# Patient Record
Sex: Female | Born: 1945 | Race: White | Hispanic: No | Smoking: Never smoker
Health system: Southern US, Community
[De-identification: ages and names within clinical notes are randomized; demographics above are authoritative.]

## PROBLEM LIST (undated history)

## (undated) DIAGNOSIS — E559 Vitamin D deficiency, unspecified: Secondary | ICD-10-CM

## (undated) DIAGNOSIS — K219 Gastro-esophageal reflux disease without esophagitis: Secondary | ICD-10-CM

## (undated) DIAGNOSIS — F329 Major depressive disorder, single episode, unspecified: Secondary | ICD-10-CM

## (undated) DIAGNOSIS — G35 Multiple sclerosis: Secondary | ICD-10-CM

## (undated) DIAGNOSIS — R42 Dizziness and giddiness: Secondary | ICD-10-CM

## (undated) DIAGNOSIS — F32A Depression, unspecified: Secondary | ICD-10-CM

## (undated) HISTORY — PX: LAPAROSCOPIC NISSEN FUNDOPLICATION: SHX1932

## (undated) HISTORY — PX: CHOLECYSTECTOMY: SHX55

---

## 2016-10-13 ENCOUNTER — Encounter (HOSPITAL_COMMUNITY)
Admission: EM | Disposition: A | Discharge status: Skilled Nursing Facility | Payer: Self-pay | Source: Home / Self Care | Attending: Orthopaedic Surgery

## 2016-10-13 ENCOUNTER — Emergency Department (HOSPITAL_COMMUNITY): Payer: Medicare Other

## 2016-10-13 ENCOUNTER — Encounter (HOSPITAL_COMMUNITY): Payer: Self-pay

## 2016-10-13 ENCOUNTER — Inpatient Hospital Stay (HOSPITAL_COMMUNITY)
Admission: EM | Admit: 2016-10-13 | Discharge: 2016-10-18 | DRG: 494 | Disposition: A | Discharge status: Skilled Nursing Facility | Payer: Medicare Other | Attending: Orthopaedic Surgery | Admitting: Orthopaedic Surgery

## 2016-10-13 DIAGNOSIS — S82401B Unspecified fracture of shaft of right fibula, initial encounter for open fracture type I or II: Secondary | ICD-10-CM | POA: Diagnosis present

## 2016-10-13 DIAGNOSIS — S82301B Unspecified fracture of lower end of right tibia, initial encounter for open fracture type I or II: Secondary | ICD-10-CM

## 2016-10-13 DIAGNOSIS — G35 Multiple sclerosis: Secondary | ICD-10-CM | POA: Diagnosis present

## 2016-10-13 DIAGNOSIS — R339 Retention of urine, unspecified: Secondary | ICD-10-CM | POA: Diagnosis not present

## 2016-10-13 DIAGNOSIS — W19XXXA Unspecified fall, initial encounter: Secondary | ICD-10-CM | POA: Diagnosis present

## 2016-10-13 DIAGNOSIS — E1136 Type 2 diabetes mellitus with diabetic cataract: Secondary | ICD-10-CM | POA: Diagnosis present

## 2016-10-13 DIAGNOSIS — Z419 Encounter for procedure for purposes other than remedying health state, unspecified: Secondary | ICD-10-CM

## 2016-10-13 DIAGNOSIS — E1151 Type 2 diabetes mellitus with diabetic peripheral angiopathy without gangrene: Secondary | ICD-10-CM | POA: Diagnosis present

## 2016-10-13 DIAGNOSIS — F329 Major depressive disorder, single episode, unspecified: Secondary | ICD-10-CM | POA: Diagnosis present

## 2016-10-13 DIAGNOSIS — K449 Diaphragmatic hernia without obstruction or gangrene: Secondary | ICD-10-CM | POA: Diagnosis present

## 2016-10-13 DIAGNOSIS — H269 Unspecified cataract: Secondary | ICD-10-CM | POA: Diagnosis present

## 2016-10-13 DIAGNOSIS — M6281 Muscle weakness (generalized): Secondary | ICD-10-CM

## 2016-10-13 DIAGNOSIS — S82201B Unspecified fracture of shaft of right tibia, initial encounter for open fracture type I or II: Secondary | ICD-10-CM | POA: Diagnosis not present

## 2016-10-13 DIAGNOSIS — Z96651 Presence of right artificial knee joint: Secondary | ICD-10-CM | POA: Diagnosis present

## 2016-10-13 HISTORY — DX: Depression, unspecified: F32.A

## 2016-10-13 HISTORY — DX: Major depressive disorder, single episode, unspecified: F32.9

## 2016-10-13 HISTORY — DX: Multiple sclerosis: G35

## 2016-10-13 HISTORY — PX: EXTERNAL FIXATION LEG: SHX1549

## 2016-10-13 LAB — BASIC METABOLIC PANEL
ANION GAP: 11 (ref 5–15)
BUN: 14 mg/dL (ref 6–20)
CHLORIDE: 106 mmol/L (ref 101–111)
CO2: 22 mmol/L (ref 22–32)
Calcium: 9.1 mg/dL (ref 8.9–10.3)
Creatinine, Ser: 0.77 mg/dL (ref 0.44–1.00)
GFR calc Af Amer: >60 mL/min (ref >60)
GFR calc non Af Amer: >60 mL/min (ref >60)
GLUCOSE: 122 mg/dL — AB (ref 65–99)
POTASSIUM: 4.3 mmol/L (ref 3.5–5.1)
Sodium: 139 mmol/L (ref 135–145)

## 2016-10-13 LAB — CK: CK TOTAL: 208 U/L (ref 38–234)

## 2016-10-13 LAB — CBC WITH DIFFERENTIAL/PLATELET
BASOS ABS: 0 10*3/uL (ref 0.0–0.1)
Basophils Relative: 0 %
EOS PCT: 1 %
Eosinophils Absolute: 0.1 10*3/uL (ref 0.0–0.7)
HEMATOCRIT: 38 % (ref 36.0–46.0)
HEMOGLOBIN: 12.8 g/dL (ref 12.0–15.0)
LYMPHS ABS: 0.9 10*3/uL (ref 0.7–4.0)
LYMPHS PCT: 6 %
MCH: 30.4 pg (ref 26.0–34.0)
MCHC: 33.7 g/dL (ref 30.0–36.0)
MCV: 90.3 fL (ref 78.0–100.0)
Monocytes Absolute: 0.7 10*3/uL (ref 0.1–1.0)
Monocytes Relative: 5 %
NEUTROS ABS: 13.3 10*3/uL — AB (ref 1.7–7.7)
NEUTROS PCT: 88 %
Platelets: 318 10*3/uL (ref 150–400)
RBC: 4.21 MIL/uL (ref 3.87–5.11)
RDW: 13.1 % (ref 11.5–15.5)
WBC: 15.1 10*3/uL — AB (ref 4.0–10.5)

## 2016-10-13 LAB — PROTIME-INR
INR: 0.89
Prothrombin Time: 12 seconds (ref 11.4–15.2)

## 2016-10-13 SURGERY — EXTERNAL FIXATION, LOWER EXTREMITY
Anesthesia: General | Laterality: Right

## 2016-10-13 MED ORDER — FENTANYL CITRATE (PF) 100 MCG/2ML IJ SOLN
25.0000 ug | Freq: Once | INTRAMUSCULAR | Status: AC
  Administered 2016-10-13: 25 ug via INTRAVENOUS
  Filled 2016-10-13: qty 2

## 2016-10-13 MED ORDER — TETANUS-DIPHTH-ACELL PERTUSSIS 5-2.5-18.5 LF-MCG/0.5 IM SUSP
0.5000 mL | Freq: Once | INTRAMUSCULAR | Status: AC
  Administered 2016-10-13: 0.5 mL via INTRAMUSCULAR
  Filled 2016-10-13: qty 0.5

## 2016-10-13 MED ORDER — ONDANSETRON HCL 4 MG/2ML IJ SOLN
4.0000 mg | Freq: Once | INTRAMUSCULAR | Status: AC
  Administered 2016-10-13: 4 mg via INTRAVENOUS
  Filled 2016-10-13: qty 2

## 2016-10-13 MED ORDER — CEFAZOLIN SODIUM-DEXTROSE 1-4 GM/50ML-% IV SOLN
1.0000 g | Freq: Once | INTRAVENOUS | Status: AC
  Administered 2016-10-13: 1 g via INTRAVENOUS
  Filled 2016-10-13: qty 50

## 2016-10-13 SURGICAL SUPPLY — 50 items
BANDAGE ACE 4X5 VEL STRL LF (GAUZE/BANDAGES/DRESSINGS) ×3 IMPLANT
BANDAGE ACE 6X5 VEL STRL LF (GAUZE/BANDAGES/DRESSINGS) ×3 IMPLANT
BANDAGE ESMARK 6X9 LF (GAUZE/BANDAGES/DRESSINGS) ×1 IMPLANT
BAR EXFX 350X11 NS LF (EXFIX) ×2
BAR GLASS FIBER EXFX 11X350 (EXFIX) ×4 IMPLANT
BNDG CMPR 9X6 STRL LF SNTH (GAUZE/BANDAGES/DRESSINGS) ×1
BNDG COHESIVE 6X5 TAN STRL LF (GAUZE/BANDAGES/DRESSINGS) ×3 IMPLANT
BNDG ESMARK 6X9 LF (GAUZE/BANDAGES/DRESSINGS) ×3
BNDG GAUZE ELAST 4 BULKY (GAUZE/BANDAGES/DRESSINGS) ×4 IMPLANT
CLAMP BLUE BAR TO PIN (EXFIX) ×4 IMPLANT
COVER SURGICAL LIGHT HANDLE (MISCELLANEOUS) ×3 IMPLANT
CUFF TOURNIQUET SINGLE 24IN (TOURNIQUET CUFF) IMPLANT
CUFF TOURNIQUET SINGLE 34IN LL (TOURNIQUET CUFF) IMPLANT
DRAPE C-ARM 42X72 X-RAY (DRAPES) ×2 IMPLANT
DRAPE U-SHAPE 47X51 STRL (DRAPES) ×3 IMPLANT
DRSG ADAPTIC 3X8 NADH LF (GAUZE/BANDAGES/DRESSINGS) ×3 IMPLANT
ELECT REM PT RETURN 9FT ADLT (ELECTROSURGICAL) ×3
ELECTRODE REM PT RTRN 9FT ADLT (ELECTROSURGICAL) ×1 IMPLANT
GAUZE SPONGE 4X4 12PLY STRL (GAUZE/BANDAGES/DRESSINGS) ×3 IMPLANT
GAUZE XEROFORM 5X9 LF (GAUZE/BANDAGES/DRESSINGS) ×2 IMPLANT
GLOVE BIOGEL PI IND STRL 8 (GLOVE) ×2 IMPLANT
GLOVE BIOGEL PI INDICATOR 8 (GLOVE) ×4
GLOVE ORTHO TXT STRL SZ7.5 (GLOVE) ×6 IMPLANT
GOWN STRL REUS W/ TWL LRG LVL3 (GOWN DISPOSABLE) ×2 IMPLANT
GOWN STRL REUS W/TWL 2XL LVL3 (GOWN DISPOSABLE) ×3 IMPLANT
GOWN STRL REUS W/TWL LRG LVL3 (GOWN DISPOSABLE) ×6
KIT BASIN OR (CUSTOM PROCEDURE TRAY) ×3 IMPLANT
KIT ROOM TURNOVER OR (KITS) ×3 IMPLANT
MANIFOLD NEPTUNE II (INSTRUMENTS) ×3 IMPLANT
NS IRRIG 1000ML POUR BTL (IV SOLUTION) ×5 IMPLANT
PACK ORTHO EXTREMITY (CUSTOM PROCEDURE TRAY) ×3 IMPLANT
PAD ARMBOARD 7.5X6 YLW CONV (MISCELLANEOUS) ×6 IMPLANT
PADDING CAST COTTON 6X4 STRL (CAST SUPPLIES) ×9 IMPLANT
PENCIL BUTTON HOLSTER BLD 10FT (ELECTRODE) IMPLANT
PIN CLAMP 2BAR 75MM BLUE (EXFIX) ×2 IMPLANT
PIN HALF YELLOW 5X160X35 (EXFIX) ×4 IMPLANT
PIN TRANSFIXING 5.0 (EXFIX) ×2 IMPLANT
SPONGE LAP 18X18 X RAY DECT (DISPOSABLE) ×3 IMPLANT
STOCKINETTE IMPERVIOUS LG (DRAPES) ×3 IMPLANT
SUT ETHILON 3 0 PS 1 (SUTURE) IMPLANT
SUT VIC AB 0 CT1 27 (SUTURE) ×6
SUT VIC AB 0 CT1 27XBRD ANBCTR (SUTURE) ×2 IMPLANT
SUT VIC AB 2-0 CT1 27 (SUTURE) ×6
SUT VIC AB 2-0 CT1 TAPERPNT 27 (SUTURE) ×2 IMPLANT
TOWEL OR 17X24 6PK STRL BLUE (TOWEL DISPOSABLE) ×6 IMPLANT
TOWEL OR 17X26 10 PK STRL BLUE (TOWEL DISPOSABLE) ×3 IMPLANT
TUBE CONNECTING 12'X1/4 (SUCTIONS)
TUBE CONNECTING 12X1/4 (SUCTIONS) IMPLANT
UNDERPAD 30X30 (UNDERPADS AND DIAPERS) ×3 IMPLANT
YANKAUER SUCT BULB TIP NO VENT (SUCTIONS) IMPLANT

## 2016-10-13 NOTE — ED Provider Notes (Signed)
[facility]-EMERGENCY DEPT Provider Note   CSN: [id number] Arrival date & time: 10/13/16  1633     History   Chief Complaint Chief Complaint  Patient presents with  . Fall    HPI Ariana Patton is a 71 y.o. female.  HPI Patient has a history Of MS, depression, DM, neuropathy, GERD who presents from skilled nursing facility after fall. Concern for open right tibia fracture. Unable to speak to staff from facility. Patient is somewhat poor historian, reports falling when trying to use the commode. She denies hitting her head or any LOC. Per EMS, patient was lying on floor 1 hours prior to their arrival.   Past Medical History:  Diagnosis Date  . Depression   . Multiple sclerosis [facility])     Patient Active Problem List   Diagnosis Date Noted  . Open fracture of right tibia and fibula 10/14/2016    Past Surgical History:  Procedure Laterality Date  . EXTERNAL FIXATION LEG Right 10/13/2016   Procedure: Application of external fixator Right lower leg;  Surgeon: Eldred Manges, MD;  Location: [facility] OR;  Service: Orthopedics;  Laterality: Right;    OB History    No data available       Home Medications    Prior to Admission medications   Medication Sig Start Date End Date Taking? Authorizing Provider  buPROPion (WELLBUTRIN SR) 150 MG 12 hr tablet Take 150 mg by mouth 2 (two) times daily.   Yes [provider]  celecoxib (CELEBREX) 100 MG capsule Take 100 mg by mouth 2 (two) times daily.   Yes [provider]  cholestyramine light (PREVALITE) 4 g packet Take 4 g by mouth 2 (two) times daily.   Yes [provider]  fluticasone (FLONASE) 50 MCG/ACT nasal spray Place 1 spray into both nostrils 2 (two) times daily.   Yes [provider]  gabapentin (NEURONTIN) 300 MG capsule Take 300 mg by mouth 3 (three) times daily.   Yes [provider]  mirabegron ER (MYRBETRIQ) 25 MG TB24 tablet Take 25 mg by mouth daily.   Yes [provider]    PARoxetine (PAXIL) 20 MG tablet Take 20 mg by mouth daily.   Yes [provider]  temazepam (RESTORIL) 30 MG capsule Take 30 mg by mouth at bedtime.   Yes [provider]  Vitamin D, Ergocalciferol, (DRISDOL) 50000 units CAPS capsule Take 50,000 Units by mouth every 7 (seven) days.   Yes [provider]    Family History No family history on file.  Social History Social History  Substance Use Topics  . Smoking status: Not on file  . Smokeless tobacco: Not on file  . Alcohol use Not on file     Allergies   Patient has no known allergies.   Review of Systems Review of Systems  Constitutional: Negative for chills and fever.  HENT: Negative for ear pain and sore throat.   Eyes: Negative for pain and visual disturbance.  Respiratory: Negative for cough and shortness of breath.   Cardiovascular: Negative for chest pain and palpitations.  Gastrointestinal: Negative for abdominal pain and vomiting.  Genitourinary: Negative for dysuria and hematuria.  Musculoskeletal: Positive for gait problem and joint swelling. Negative for arthralgias and back pain.  Skin: Negative for color change and rash.  Neurological: Negative for seizures and syncope.  All other systems reviewed and are negative.    Physical Exam Updated Vital Signs BP (!) 85/36 (BP Location: Left Arm)   Pulse 75  Temp 97.6 F (36.4 C) (Oral)   Resp 16   SpO2 95%   Physical Exam  Constitutional: She appears well-developed and well-nourished. No distress.  HENT:  Head: Normocephalic and atraumatic.  Eyes: Conjunctivae are normal.  Neck: Neck supple.  Cardiovascular: Normal rate and regular rhythm.   No murmur heard. Pulmonary/Chest: Effort normal and breath sounds normal. No respiratory distress.  Abdominal: Soft. There is no tenderness.  Musculoskeletal: She exhibits tenderness and deformity. She exhibits no edema.  Right lower extremity with open fracture distal tibia and gross  deformity. Distal pulses intact. Sensation intact.   Neurological: She is alert. No cranial nerve deficit or sensory deficit. She exhibits normal muscle tone.  Skin: Skin is warm and dry.  Psychiatric: She has a normal mood and affect.  Nursing note and vitals reviewed.    ED Treatments / Results  Labs (all labs ordered are listed, but only abnormal results are displayed) Labs Reviewed  CBC WITH DIFFERENTIAL/PLATELET - Abnormal; Notable for the following:       Result Value   WBC 15.1 (*)    Neutro Abs 13.3 (*)    All other components within normal limits  BASIC METABOLIC PANEL - Abnormal; Notable for the following:    Glucose, Bld 122 (*)    All other components within normal limits  PROTIME-INR  CK  URINALYSIS, ROUTINE W REFLEX MICROSCOPIC    EKG  EKG Interpretation  Date/Time:  Friday October 13 2016 19:40:56 EDT Ventricular Rate:  74 PR Interval:    QRS Duration: 91 QT Interval:  397 QTC Calculation: 441 R Axis:   41 Text Interpretation:  Sinus rhythm Low voltage, precordial leads Abnormal R-wave progression, early transition Borderline T abnormalities, anterior leads No old tracing to compare Confirmed by Linwood Dibbles [phone]) on 10/14/2016 12:19:40 AM Also confirmed by Linwood Dibbles [phone]), editor Misty Stanley [phone])  on 10/14/2016 8:52:24 AM       Radiology Dg Chest 1 View  Result Date: 10/13/2016 CLINICAL DATA:  Fall with leg fracture EXAM: CHEST 1 VIEW COMPARISON:  None. FINDINGS: Elevation of the right hemidiaphragm. No pneumothorax or sizable pleural effusion. No focal airspace consolidation or pulmonary edema. Moderate hiatal hernia. IMPRESSION: No active disease. Electronically Signed   By: Deatra Robinson M.D.   On: 10/13/2016 18:06   Dg Tibia/fibula Right  Result Date: 10/14/2016 CLINICAL DATA:  External fixation EXAM: DG C-ARM 61-120 MIN; RIGHT TIBIA AND FIBULA - 2 VIEW COMPARISON:  10/13/2016 FINDINGS: Total fluoroscopy time was 18 seconds. Two low  resolution spot intraoperative views of the right tibia and fibula. Re- demonstrated comminuted and displaced fractures of the distal shafts of the tibia and fibula with decreased angulation compared to preoperative radiographs. IMPRESSION: Intraoperative fluoroscopic assistance provided during external fixation of tibial and fibular fracture Electronically Signed   By: Jasmine Pang M.D.   On: 10/14/2016 02:13   Dg Tibia/fibula Right  Result Date: 10/13/2016 CLINICAL DATA:  Fall.  Open right tib-fib fracture. EXAM: RIGHT TIBIA AND FIBULA - 2 VIEW COMPARISON:  None. FINDINGS: Comminuted oblique non articular distal shaft right tibia fracture with mild apex medial angulation and 15 mm medial displacement of the dominant distal fracture fragment. Comminuted distal shaft oblique non articular right fibular fracture with mild apex medial angulation and 8-9 mm medial and posterior displacement of the dominant distal fracture fragment, noting a 5 cm posterior butterfly fracture fragment in the right distal fibula. No suspicious focal osseous lesions. Partially visualized right total knee arthroplasty hardware  with surgical plate overlying the distal right femur. IMPRESSION: Comminuted displaced angulated non articular distal shaft fractures in the right tibia and fibula as detailed. Electronically Signed   By: Delbert Phenix M.D.   On: 10/13/2016 18:08   Ct Head Wo Contrast  Result Date: 10/13/2016 CLINICAL DATA:  Pain following fall EXAM: CT HEAD WITHOUT CONTRAST TECHNIQUE: Contiguous axial images were obtained from the base of the skull through the vertex without intravenous contrast. COMPARISON:  None. FINDINGS: Brain: There is moderate diffuse atrophy. There is no intracranial mass, hemorrhage, extra-axial fluid collection, or midline shift. There is small vessel disease throughout the centra semiovale bilaterally. Elsewhere gray-white compartments appear normal. No evident acute infarct. Vascular: There is no  appreciable hyperdense vessel. There is calcification in each carotid siphon. Skull: The bony calvarium appears intact. Sinuses/Orbits: There is mild mucosal thickening in several ethmoid air cells bilaterally. Other visualized paranasal sinuses are clear. Orbits appear symmetric bilaterally. Patient has had cataract removals bilaterally. Other: Mastoid air cells are clear. IMPRESSION: Atrophy with small vessel disease throughout much of the periventricular white matter. No intracranial mass, hemorrhage, or extra-axial fluid collection. No evident acute infarct. There are areas of arterial vascular calcification. Areas of ethmoid sinus disease noted. Electronically Signed   By: Bretta Bang III M.D.   On: 10/13/2016 18:10   Dg C-arm 1-60 Min  Result Date: 10/14/2016 CLINICAL DATA:  External fixation EXAM: DG C-ARM 61-120 MIN; RIGHT TIBIA AND FIBULA - 2 VIEW COMPARISON:  10/13/2016 FINDINGS: Total fluoroscopy time was 18 seconds. Two low resolution spot intraoperative views of the right tibia and fibula. Re- demonstrated comminuted and displaced fractures of the distal shafts of the tibia and fibula with decreased angulation compared to preoperative radiographs. IMPRESSION: Intraoperative fluoroscopic assistance provided during external fixation of tibial and fibular fracture Electronically Signed   By: Jasmine Pang M.D.   On: 10/14/2016 02:13    Procedures Procedures (including critical care time)  Medications Ordered in ED Medications  buPROPion [facility] SR) 12 hr tablet 150 mg (150 mg Oral Given 10/14/16 0836)  celecoxib (CELEBREX) capsule 100 mg (not administered)  cholestyramine light (PREVALITE) packet 4 g (4 g Oral Given 10/14/16 0833)  fluticasone (FLONASE) 50 MCG/ACT nasal spray 1 spray (1 spray Each Nare Given 10/14/16 0838)  gabapentin (NEURONTIN) capsule 300 mg (300 mg Oral Given 10/14/16 0835)  mirabegron ER (MYRBETRIQ) tablet 25 mg (25 mg Oral Given 10/14/16 0846)  PARoxetine  (PAXIL) tablet 20 mg (20 mg Oral Given 10/14/16 0836)  temazepam (RESTORIL) capsule 30 mg (not administered)  Vitamin D (Ergocalciferol) (DRISDOL) capsule 50,000 Units (not administered)  acetaminophen (TYLENOL) tablet 650 mg (650 mg Oral Given 10/14/16 0550)    Or  acetaminophen (TYLENOL) suppository 650 mg ( Rectal See Alternative 10/14/16 0550)  ondansetron (ZOFRAN) tablet 4 mg ( Oral See Alternative 10/14/16 0549)    Or  ondansetron (ZOFRAN) injection 4 mg (4 mg Intravenous Given 10/14/16 0549)  metoCLOPramide (REGLAN) tablet 5-10 mg (not administered)    Or  metoCLOPramide (REGLAN) injection 5-10 mg (not administered)  0.45 % sodium chloride infusion ( Intravenous New Bag/Given 10/14/16 0404)  ceFAZolin (ANCEF) IVPB 1 g/50 mL premix (1 g Intravenous New Bag/Given 10/14/16 1146)  ketorolac (TORADOL) 15 MG/ML injection 7.5 mg (7.5 mg Intravenous Given 10/14/16 0839)  oxyCODONE (Oxy IR/ROXICODONE) immediate release tablet 5-10 mg (10 mg Oral Given 10/14/16 1037)  morphine 4 MG/ML injection 2 mg (2 mg Intravenous Given 10/14/16 0543)  methocarbamol (ROBAXIN) tablet 500 mg (500  mg Oral Given 10/14/16 1037)    Or  methocarbamol (ROBAXIN) 500 mg in dextrose 5 % 50 mL IVPB ( Intravenous See Alternative 10/14/16 1037)  docusate sodium (COLACE) capsule 100 mg (100 mg Oral Not Given 10/14/16 0835)  polyethylene glycol (MIRALAX / GLYCOLAX) packet 17 g (not administered)  bisacodyl (DULCOLAX) suppository 10 mg (not administered)  sodium phosphate (FLEET) 7-19 GM/118ML enema 1 enema (not administered)  ondansetron (ZOFRAN) 4 MG/2ML injection (not administered)  ketorolac (TORADOL) 15 MG/ML injection (not administered)  ceFAZolin (ANCEF) IVPB 1 g/50 mL premix (0 g Intravenous Stopped 10/13/16 1857)  Tdap (BOOSTRIX) injection 0.5 mL (0.5 mLs Intramuscular Given 10/13/16 1827)  fentaNYL (SUBLIMAZE) injection 25 mcg (25 mcg Intravenous Given 10/13/16 1827)  ondansetron (ZOFRAN) injection 4 mg (4 mg Intravenous Given  10/13/16 1827)  fentaNYL (SUBLIMAZE) injection 25 mcg (25 mcg Intravenous Given 10/13/16 2044)  ondansetron (ZOFRAN) injection 4 mg (4 mg Intravenous Given 10/14/16 0242)     Initial Impression / Assessment and Plan / ED Course  I have reviewed the triage vital signs and the nursing notes.  Pertinent labs & imaging results that were available during my care of the patient were reviewed by me and considered in my medical decision making (see chart for details).    Patient is a 71 y/o female with history of MS, depression, DM, neuropathy, GERD who presents from skilled nursing facility who presents after unobserved ground level fall. Noted to have open fracture of right tib/fib, neurovascularly intact. Exam as above.   Unable to obtain additional history from nursing facility. Syncopal work up completed, significant for Negative CT head, chest x-ray, and stable labs.  Patient treated with ancef and tetanus updated, as well as pain control.   Discussed with Orthopedics. Plan for admission to OR for I&D and ex-fix. Hospitalist consulted, felt fall was mechanical in nature. Patient in agreement with plan at time of admission.  Patient and plan of care discussed with Attending physician, Dr. Lynelle Doctor.    Final Clinical Impressions(s) / ED Diagnoses   Final diagnoses:  Type I or II open fracture of distal end of right tibia, unspecified fracture morphology, initial encounter    New Prescriptions Current Discharge Medication List       Wynelle Cleveland, MD 10/14/16 1419

## 2016-10-13 NOTE — Consult Note (Signed)
Hospitalist Service Medical Consultation   Marikay Roads  RUE:[id number]  DOB: 03-11-1945  DOA: 10/13/2016  PCP: Patient, No Pcp Per      Requesting physician: Wynelle Cleveland, MD  Reason for consultation: Fall   History of Present Illness: Merlyn Bollen is an 71 y.o. female with past medical history significant for advanced MS who presents with fall and open tib-fib fracture and for whom [facility] is consulted regarding diabetes management and evaluation of syncope.  The patient tells me that she moved to [facility] to be near family a few months ago for rehabilitation after a hospitalization in [place] for a particularly bad MS flare. Since then she has been doing well in rehabilitation, walking with a walker, doing well. She was trying to go to the bathroom, and transfer herself to the commode, but neglected to call AMA because she thought she could do it herself. However her "legs gave out", as they often do, and she fell, hurting her leg severely.  She had no loss of consciousness.  She had no preceding dizziness, lightheadedness, chest pain, SOB, palpitations, nor does she have any of those symptoms now, only severe leg pain.  She has no history of coronary disease, other heart disease, or diabetes.      Review of Systems:  12 systems were reviewed and were negative  Past Medical History: Past Medical History:  Diagnosis Date  . Depression   . Multiple sclerosis (HCC)     Past Surgical History: History reviewed. No pertinent surgical history.   Allergies:  No Known Allergies   Social History:  has no tobacco, alcohol, and drug history on file.     Physical Exam: Vitals:   10/13/16 1700 10/13/16 1815 10/13/16 1900 10/13/16 2000  BP: 106/70 112/72 (!) 105/91 (!) 118/59  Pulse: 71 75 84 76  Resp:   11 15  SpO2: 93% 97% 98% 96%    Constitutional: Alert and awake, oriented x3, not in any acute distress. Eyes: PERLA, EOMI, irises appear normal,  anicteric sclera,  ENMT: external ears and nose appear normal, hearing normal            Lips appears dry, oropharynx mucosa, tongue, posterior pharynx appear normal  CVS: S1-S2 clear, no murmur rubs or gallops Respiratory:  Respiratory effort normal. No accessory muscle use.  Musculoskeletal: contractures in both arms, legs.  Fracture covered  Neuro: Cranial nerves II-XII intact, strength globally weak but symmetric. Leg testing not done Psych: judgement and insight appear normal, stable mood and affect, mental status Skin: no rashes or lesions or ulcers, no induration or nodules    Data reviewed:  I have personally reviewed following labs and imaging studies Labs:  CBC:  Recent Labs Lab 10/13/16 1841  WBC 15.1*  NEUTROABS 13.3*  HGB 12.8  HCT 38.0  MCV 90.3  PLT 318    Basic Metabolic Panel:  Recent Labs Lab 10/13/16 1841  NA 139  K 4.3  CL 106  CO2 22  GLUCOSE 122*  BUN 14  CREATININE 0.77  CALCIUM 9.1   GFR CrCl cannot be calculated (Unknown ideal weight.). Liver Function Tests: No results for input(s): AST, ALT, ALKPHOS, BILITOT, PROT, ALBUMIN in the last 168 hours. No results for input(s): LIPASE, AMYLASE in the last 168 hours. No results for input(s): AMMONIA in the last 168 hours. Coagulation profile  Recent Labs Lab 10/13/16 1841  INR 0.89    Cardiac  Enzymes:  Recent Labs Lab 10/13/16 1841  CKTOTAL 208   BNP: Invalid input(s): POCBNP CBG: No results for input(s): GLUCAP in the last 168 hours. D-Dimer No results for input(s): DDIMER in the last 72 hours. Hgb A1c No results for input(s): HGBA1C in the last 72 hours. Lipid Profile No results for input(s): CHOL, HDL, LDLCALC, TRIG, CHOLHDL, LDLDIRECT in the last 72 hours. Thyroid function studies No results for input(s): TSH, T4TOTAL, T3FREE, THYROIDAB in the last 72 hours.  Invalid input(s): FREET3 Anemia work up No results for input(s): VITAMINB12, FOLATE, FERRITIN, TIBC, IRON,  RETICCTPCT in the last 72 hours. Urinalysis No results found for: COLORURINE, APPEARANCEUR, LABSPEC, PHURINE, GLUCOSEU, HGBUR, BILIRUBINUR, KETONESUR, PROTEINUR, UROBILINOGEN, NITRITE, LEUKOCYTESUR   Sepsis Labs Invalid input(s): PROCALCITONIN,  WBC,  LACTICIDVEN Microbiology No results found for this or any previous visit (from the past 240 hour(s)).     Inpatient Medications:   Scheduled Meds: . fentaNYL (SUBLIMAZE) injection  25 mcg Intravenous Once   Continuous Infusions:   Radiological Exams on Admission: Dg Chest 1 View  Result Date: 10/13/2016 CLINICAL DATA:  Fall with leg fracture EXAM: CHEST 1 VIEW COMPARISON:  None. FINDINGS: Elevation of the right hemidiaphragm. No pneumothorax or sizable pleural effusion. No focal airspace consolidation or pulmonary edema. Moderate hiatal hernia. IMPRESSION: No active disease. Electronically Signed   By: Deatra Robinson M.D.   On: 10/13/2016 18:06   Dg Tibia/fibula Right  Result Date: 10/13/2016 CLINICAL DATA:  Fall.  Open right tib-fib fracture. EXAM: RIGHT TIBIA AND FIBULA - 2 VIEW COMPARISON:  None. FINDINGS: Comminuted oblique non articular distal shaft right tibia fracture with mild apex medial angulation and 15 mm medial displacement of the dominant distal fracture fragment. Comminuted distal shaft oblique non articular right fibular fracture with mild apex medial angulation and 8-9 mm medial and posterior displacement of the dominant distal fracture fragment, noting a 5 cm posterior butterfly fracture fragment in the right distal fibula. No suspicious focal osseous lesions. Partially visualized right total knee arthroplasty hardware with surgical plate overlying the distal right femur. IMPRESSION: Comminuted displaced angulated non articular distal shaft fractures in the right tibia and fibula as detailed. Electronically Signed   By: Delbert Phenix M.D.   On: 10/13/2016 18:08   Ct Head Wo Contrast  Result Date: 10/13/2016 CLINICAL DATA:   Pain following fall EXAM: CT HEAD WITHOUT CONTRAST TECHNIQUE: Contiguous axial images were obtained from the base of the skull through the vertex without intravenous contrast. COMPARISON:  None. FINDINGS: Brain: There is moderate diffuse atrophy. There is no intracranial mass, hemorrhage, extra-axial fluid collection, or midline shift. There is small vessel disease throughout the centra semiovale bilaterally. Elsewhere gray-white compartments appear normal. No evident acute infarct. Vascular: There is no appreciable hyperdense vessel. There is calcification in each carotid siphon. Skull: The bony calvarium appears intact. Sinuses/Orbits: There is mild mucosal thickening in several ethmoid air cells bilaterally. Other visualized paranasal sinuses are clear. Orbits appear symmetric bilaterally. Patient has had cataract removals bilaterally. Other: Mastoid air cells are clear. IMPRESSION: Atrophy with small vessel disease throughout much of the periventricular white matter. No intracranial mass, hemorrhage, or extra-axial fluid collection. No evident acute infarct. There are areas of arterial vascular calcification. Areas of ethmoid sinus disease noted. Electronically Signed   By: Bretta Bang III M.D.   On: 10/13/2016 18:10    Impression/Recommendations 1. Fall and tib-fib fracture: The patient appears to me a reliable historian, gives a cogent history of falls, similar to previous  falls (which are a result of substantial morbidity from her MS).  She did not syncopize. -Fracture care per Orthopedics -Would be reasonable to continue Paxil, gabapentin, Wellbutrin, Restoril, cholestyramine, and mirabegron perioperatively, only caution regarding sedation and post-op delirium with oversedation plus Restoril.  2. Reported diabetes: The patient does not have diabetes.    Thank you for this consultation.  The hospitalist team will sign off, please call back if medical issues arise.   Alberteen Sam M.D. [facility] 10/13/2016, 8:41 PM

## 2016-10-13 NOTE — ED Provider Notes (Signed)
Patient presents to the emergency room for evaluation after falling in the bathroom. Patient's a resident of a nursing facility.  Patient is not exactly sure how she fell. No known loss of consciousness however the patient is a bit confused about what occurred.  On exam patient has an open wound on her right lower tib-fib consistent with an open fracture. There is no visible bone but there is a break in the skin and some visible adipose tissue.  Plan on laboratory tests, preop clearance. Anticipate she'll need to go to the OR for open fractures.  I saw and evaluated the patient, reviewed the resident's note and I agree with the findings and plan.   EKG Interpretation  Date/Time:  Friday October 13 2016 19:40:56 EDT Ventricular Rate:  74 PR Interval:    QRS Duration: 91 QT Interval:  397 QTC Calculation: 441 R Axis:   41 Text Interpretation:  Sinus rhythm Low voltage, precordial leads Abnormal R-wave progression, early transition Borderline T abnormalities, anterior leads No old tracing to compare Confirmed by Linwood Dibbles [phone]) on 10/14/2016 12:19:40 AM Also confirmed by Linwood Dibbles [phone]), editor Misty Stanley [phone])  on 10/14/2016 8:52:24 AM         Linwood Dibbles, MD 10/15/16 1925

## 2016-10-13 NOTE — H&P (Signed)
Ariana Patton is an 71 y.o. female.   Chief Complaint: Fall with right open tib-fib fracture, grade 26-14 HPI: 71 year old female who has been in the skilled nursing facility previous right total knee arthroplasty with right distal femur fracture fixed with a lateral plate was found on the floor with unobserved fall. She has a history of multiple sclerosis  Past Medical History:  Diagnosis Date  . Depression   . Multiple sclerosis ([place])     History reviewed. No pertinent surgical history.  No family history on file. Social History:  has no tobacco, alcohol, and drug history on file.  Allergies: No Known Allergies   (Not in a hospital admission)  Results for orders placed or performed during the hospital encounter of 10/13/16 (from the past 48 hour(s))  CBC with Differential     Status: Abnormal   Collection Time: 10/13/16  6:41 PM  Result Value Ref Range   WBC 15.1 (H) 4.0 - 10.5 K/uL   RBC 4.21 3.87 - 5.11 MIL/uL   Hemoglobin 12.8 12.0 - 15.0 g/dL   HCT 38.0 36.0 - 46.0 %   MCV 90.3 78.0 - 100.0 fL   MCH 30.4 26.0 - 34.0 pg   MCHC 33.7 30.0 - 36.0 g/dL   RDW 13.1 11.5 - 15.5 %   Platelets 318 150 - 400 K/uL   Neutrophils Relative % 88 %   Neutro Abs 13.3 (H) 1.7 - 7.7 K/uL   Lymphocytes Relative 6 %   Lymphs Abs 0.9 0.7 - 4.0 K/uL   Monocytes Relative 5 %   Monocytes Absolute 0.7 0.1 - 1.0 K/uL   Eosinophils Relative 1 %   Eosinophils Absolute 0.1 0.0 - 0.7 K/uL   Basophils Relative 0 %   Basophils Absolute 0.0 0.0 - 0.1 K/uL  Basic metabolic panel     Status: Abnormal   Collection Time: 10/13/16  6:41 PM  Result Value Ref Range   Sodium 139 135 - 145 mmol/L   Potassium 4.3 3.5 - 5.1 mmol/L   Chloride 106 101 - 111 mmol/L   CO2 22 22 - 32 mmol/L   Glucose, Bld 122 (H) 65 - 99 mg/dL   BUN 14 6 - 20 mg/dL   Creatinine, Ser 0.77 0.44 - 1.00 mg/dL   Calcium 9.1 8.9 - 10.3 mg/dL   GFR calc non Af Amer >60 >60 mL/min   GFR calc Af Amer >60 >60 mL/min    Comment:  (NOTE) The eGFR has been calculated using the CKD EPI equation. This calculation has not been validated in all clinical situations. eGFR's persistently <60 mL/min signify possible Chronic Kidney Disease.    Anion gap 11 5 - 15  Protime-INR     Status: None   Collection Time: 10/13/16  6:41 PM  Result Value Ref Range   Prothrombin Time 12.0 11.4 - 15.2 seconds   INR 0.89   CK     Status: None   Collection Time: 10/13/16  6:41 PM  Result Value Ref Range   Total CK 208 38 - 234 U/L   Dg Chest 1 View  Result Date: 10/13/2016 CLINICAL DATA:  Fall with leg fracture EXAM: CHEST 1 VIEW COMPARISON:  None. FINDINGS: Elevation of the right hemidiaphragm. No pneumothorax or sizable pleural effusion. No focal airspace consolidation or pulmonary edema. Moderate hiatal hernia. IMPRESSION: No active disease. Electronically Signed   By: Ulyses Jarred M.D.   On: 10/13/2016 18:06   Dg Tibia/fibula Right  Result Date: 10/13/2016 CLINICAL DATA:  Fall.  Open right tib-fib fracture. EXAM: RIGHT TIBIA AND FIBULA - 2 VIEW COMPARISON:  None. FINDINGS: Comminuted oblique non articular distal shaft right tibia fracture with mild apex medial angulation and 15 mm medial displacement of the dominant distal fracture fragment. Comminuted distal shaft oblique non articular right fibular fracture with mild apex medial angulation and 8-9 mm medial and posterior displacement of the dominant distal fracture fragment, noting a 5 cm posterior butterfly fracture fragment in the right distal fibula. No suspicious focal osseous lesions. Partially visualized right total knee arthroplasty hardware with surgical plate overlying the distal right femur. IMPRESSION: Comminuted displaced angulated non articular distal shaft fractures in the right tibia and fibula as detailed. Electronically Signed   By: Ilona Sorrel M.D.   On: 10/13/2016 18:08   Ct Head Wo Contrast  Result Date: 10/13/2016 CLINICAL DATA:  Pain following fall EXAM: CT HEAD  WITHOUT CONTRAST TECHNIQUE: Contiguous axial images were obtained from the base of the skull through the vertex without intravenous contrast. COMPARISON:  None. FINDINGS: Brain: There is moderate diffuse atrophy. There is no intracranial mass, hemorrhage, extra-axial fluid collection, or midline shift. There is small vessel disease throughout the centra semiovale bilaterally. Elsewhere gray-white compartments appear normal. No evident acute infarct. Vascular: There is no appreciable hyperdense vessel. There is calcification in each carotid siphon. Skull: The bony calvarium appears intact. Sinuses/Orbits: There is mild mucosal thickening in several ethmoid air cells bilaterally. Other visualized paranasal sinuses are clear. Orbits appear symmetric bilaterally. Patient has had cataract removals bilaterally. Other: Mastoid air cells are clear. IMPRESSION: Atrophy with small vessel disease throughout much of the periventricular white matter. No intracranial mass, hemorrhage, or extra-axial fluid collection. No evident acute infarct. There are areas of arterial vascular calcification. Areas of ethmoid sinus disease noted. Electronically Signed   By: Lowella Grip III M.D.   On: 10/13/2016 18:10    Review of Systems  Constitutional: Positive for malaise/fatigue. Negative for fever.  Eyes: Negative.   Respiratory: Negative.   Cardiovascular: Negative.   Musculoskeletal:       As of her multiple sclerosis previous right total knee arthroplasty previous distal femur fracture fixed with a lateral femoral plate  Neurological: Positive for weakness.  Endo/Heme/Allergies:       As if her multiple sclerosis  Psychiatric/Behavioral: Positive for depression.    Blood pressure (!) 122/59, pulse 79, resp. rate 15, SpO2 97 %. Physical Exam  Constitutional: She is oriented to person, place, and time. She appears well-developed and well-nourished.  HENT:  Head: Normocephalic.  Eyes: Pupils are equal, round, and  reactive to light.  Neck: Normal range of motion.  Cardiovascular: Normal rate.   Respiratory: Effort normal.  GI: Soft.  Musculoskeletal:  Right mid to distal shaft medial one a 2 cm laceration with open tib-fib fracture. Distal pulses are intact sensation her foot is intact. She does have some swelling in the anterior compartment which is firm but not hard.  Neurological: She is alert and oriented to person, place, and time.  Skin: Skin is warm.  Psychiatric: She has a normal mood and affect. Her behavior is normal.     Assessment/Plan Right open tib-fib fracture. Plan will be irrigation and debridement of open fracture skin subtendinous tissue muscle and bone. External fixator application. Received are discussed patient understands and agrees to proceed.  Marybelle Killings, MD 10/13/2016, 9:32 PM

## 2016-10-13 NOTE — ED Triage Notes (Signed)
Pt from [facility] pt had an assisted fall in the bath room and has an open tib/fib fracture.

## 2016-10-14 ENCOUNTER — Emergency Department (HOSPITAL_COMMUNITY): Payer: Medicare Other

## 2016-10-14 ENCOUNTER — Emergency Department (HOSPITAL_COMMUNITY): Payer: Medicare Other | Admitting: Anesthesiology

## 2016-10-14 ENCOUNTER — Encounter (HOSPITAL_COMMUNITY): Payer: Self-pay | Admitting: Orthopaedic Surgery

## 2016-10-14 DIAGNOSIS — K449 Diaphragmatic hernia without obstruction or gangrene: Secondary | ICD-10-CM | POA: Diagnosis present

## 2016-10-14 DIAGNOSIS — S82451B Displaced comminuted fracture of shaft of right fibula, initial encounter for open fracture type I or II: Secondary | ICD-10-CM

## 2016-10-14 DIAGNOSIS — S82401B Unspecified fracture of shaft of right fibula, initial encounter for open fracture type I or II: Secondary | ICD-10-CM

## 2016-10-14 DIAGNOSIS — G35 Multiple sclerosis: Secondary | ICD-10-CM | POA: Diagnosis present

## 2016-10-14 DIAGNOSIS — H269 Unspecified cataract: Secondary | ICD-10-CM | POA: Diagnosis present

## 2016-10-14 DIAGNOSIS — R339 Retention of urine, unspecified: Secondary | ICD-10-CM | POA: Diagnosis not present

## 2016-10-14 DIAGNOSIS — M79604 Pain in right leg: Secondary | ICD-10-CM | POA: Diagnosis not present

## 2016-10-14 DIAGNOSIS — S82251B Displaced comminuted fracture of shaft of right tibia, initial encounter for open fracture type I or II: Secondary | ICD-10-CM | POA: Diagnosis not present

## 2016-10-14 DIAGNOSIS — W19XXXA Unspecified fall, initial encounter: Secondary | ICD-10-CM | POA: Diagnosis not present

## 2016-10-14 DIAGNOSIS — S82401K Unspecified fracture of shaft of right fibula, subsequent encounter for closed fracture with nonunion: Secondary | ICD-10-CM | POA: Diagnosis not present

## 2016-10-14 DIAGNOSIS — F329 Major depressive disorder, single episode, unspecified: Secondary | ICD-10-CM | POA: Diagnosis present

## 2016-10-14 DIAGNOSIS — S82201K Unspecified fracture of shaft of right tibia, subsequent encounter for closed fracture with nonunion: Secondary | ICD-10-CM | POA: Diagnosis not present

## 2016-10-14 DIAGNOSIS — S82201B Unspecified fracture of shaft of right tibia, initial encounter for open fracture type I or II: Secondary | ICD-10-CM | POA: Diagnosis present

## 2016-10-14 DIAGNOSIS — Z23 Encounter for immunization: Secondary | ICD-10-CM | POA: Diagnosis present

## 2016-10-14 DIAGNOSIS — E1151 Type 2 diabetes mellitus with diabetic peripheral angiopathy without gangrene: Secondary | ICD-10-CM | POA: Diagnosis present

## 2016-10-14 DIAGNOSIS — E1136 Type 2 diabetes mellitus with diabetic cataract: Secondary | ICD-10-CM | POA: Diagnosis present

## 2016-10-14 DIAGNOSIS — Z96651 Presence of right artificial knee joint: Secondary | ICD-10-CM | POA: Diagnosis present

## 2016-10-14 MED ORDER — FENTANYL CITRATE (PF) 100 MCG/2ML IJ SOLN
25.0000 ug | INTRAMUSCULAR | Status: DC | PRN
  Administered 2016-10-14: 50 ug via INTRAVENOUS

## 2016-10-14 MED ORDER — EPHEDRINE SULFATE 50 MG/ML IJ SOLN: INTRAMUSCULAR | Status: DC | PRN

## 2016-10-14 MED ORDER — BUPROPION HCL ER (SR) 150 MG PO TB12
150.0000 mg | ORAL_TABLET | Freq: Two times a day (BID) | ORAL | Status: DC
Start: 2016-10-14 — End: 2016-10-18
  Administered 2016-10-14 – 2016-10-18 (×9): 150 mg via ORAL
  Filled 2016-10-14 (×9): qty 1

## 2016-10-14 MED ORDER — PROPOFOL 1000 MG/100ML IV EMUL
INTRAVENOUS | Status: AC
  Filled 2016-10-14: qty 100

## 2016-10-14 MED ORDER — SODIUM CHLORIDE 0.9 % IR SOLN
Status: DC | PRN
  Administered 2016-10-14: 3000 mL

## 2016-10-14 MED ORDER — ONDANSETRON HCL 4 MG/2ML IJ SOLN
4.0000 mg | Freq: Four times a day (QID) | INTRAMUSCULAR | Status: DC | PRN
  Administered 2016-10-14: 4 mg via INTRAVENOUS
  Filled 2016-10-14: qty 2

## 2016-10-14 MED ORDER — DEXTROSE 5 % IV SOLN
500.0000 mg | Freq: Four times a day (QID) | INTRAVENOUS | Status: DC | PRN
  Filled 2016-10-14: qty 5

## 2016-10-14 MED ORDER — EPHEDRINE SULFATE 50 MG/ML IJ SOLN
INTRAMUSCULAR | Status: DC | PRN
  Administered 2016-10-14: 10 mg via INTRAVENOUS

## 2016-10-14 MED ORDER — MIDAZOLAM HCL 5 MG/5ML IJ SOLN
INTRAMUSCULAR | Status: DC | PRN
  Administered 2016-10-14: .5 mg via INTRAVENOUS

## 2016-10-14 MED ORDER — ACETAMINOPHEN 325 MG PO TABS
650.0000 mg | ORAL_TABLET | Freq: Four times a day (QID) | ORAL | Status: DC | PRN
  Administered 2016-10-14 – 2016-10-17 (×2): 650 mg via ORAL
  Filled 2016-10-14 (×2): qty 2

## 2016-10-14 MED ORDER — SUGAMMADEX SODIUM 200 MG/2ML IV SOLN
INTRAVENOUS | Status: DC | PRN
  Administered 2016-10-14: 140 mg via INTRAVENOUS

## 2016-10-14 MED ORDER — SODIUM CHLORIDE 0.45 % IV SOLN
INTRAVENOUS | Status: DC
  Administered 2016-10-14 – 2016-10-16 (×2): via INTRAVENOUS

## 2016-10-14 MED ORDER — PROPOFOL 10 MG/ML IV BOLUS
INTRAVENOUS | Status: DC | PRN
  Administered 2016-10-14: 110 mg via INTRAVENOUS

## 2016-10-14 MED ORDER — MIRABEGRON ER 25 MG PO TB24
25.0000 mg | ORAL_TABLET | Freq: Every day | ORAL | Status: DC
  Administered 2016-10-14 – 2016-10-18 (×5): 25 mg via ORAL
  Filled 2016-10-14 (×5): qty 1

## 2016-10-14 MED ORDER — BISACODYL 10 MG RE SUPP: 10.0000 mg | Freq: Every day | RECTAL | Status: DC | PRN

## 2016-10-14 MED ORDER — ONDANSETRON HCL 4 MG PO TABS: 4.0000 mg | ORAL_TABLET | Freq: Four times a day (QID) | ORAL | Status: DC | PRN

## 2016-10-14 MED ORDER — LIDOCAINE HCL (CARDIAC) 20 MG/ML IV SOLN
INTRAVENOUS | Status: DC | PRN
  Administered 2016-10-14: 60 mg via INTRAVENOUS

## 2016-10-14 MED ORDER — PAROXETINE HCL 20 MG PO TABS
20.0000 mg | ORAL_TABLET | Freq: Every day | ORAL | Status: DC
  Administered 2016-10-14 – 2016-10-18 (×5): 20 mg via ORAL
  Filled 2016-10-14 (×5): qty 1

## 2016-10-14 MED ORDER — MIDAZOLAM HCL 2 MG/2ML IJ SOLN
INTRAMUSCULAR | Status: AC
  Filled 2016-10-14: qty 2

## 2016-10-14 MED ORDER — OXYCODONE HCL 5 MG PO TABS
5.0000 mg | ORAL_TABLET | ORAL | Status: DC | PRN
  Administered 2016-10-14 – 2016-10-16 (×8): 10 mg via ORAL
  Administered 2016-10-16 (×2): 5 mg via ORAL
  Administered 2016-10-17: 10 mg via ORAL
  Administered 2016-10-17 (×3): 5 mg via ORAL
  Administered 2016-10-18 (×2): 10 mg via ORAL
  Filled 2016-10-14: qty 1
  Filled 2016-10-14 (×4): qty 2
  Filled 2016-10-14: qty 1
  Filled 2016-10-14: qty 2
  Filled 2016-10-14 (×2): qty 1
  Filled 2016-10-14 (×5): qty 2
  Filled 2016-10-14: qty 1
  Filled 2016-10-14 (×3): qty 2

## 2016-10-14 MED ORDER — TEMAZEPAM 15 MG PO CAPS
30.0000 mg | ORAL_CAPSULE | Freq: Every day | ORAL | Status: DC
  Administered 2016-10-14 – 2016-10-17 (×4): 30 mg via ORAL
  Filled 2016-10-14 (×4): qty 2

## 2016-10-14 MED ORDER — METOCLOPRAMIDE HCL 5 MG PO TABS: 5.0000 mg | ORAL_TABLET | Freq: Three times a day (TID) | ORAL | Status: DC | PRN

## 2016-10-14 MED ORDER — METOCLOPRAMIDE HCL 5 MG/ML IJ SOLN: 5.0000 mg | Freq: Three times a day (TID) | INTRAMUSCULAR | Status: DC | PRN

## 2016-10-14 MED ORDER — SUCCINYLCHOLINE CHLORIDE 20 MG/ML IJ SOLN
INTRAMUSCULAR | Status: DC | PRN
  Administered 2016-10-14: 100 mg via INTRAVENOUS

## 2016-10-14 MED ORDER — CHOLESTYRAMINE LIGHT 4 G PO PACK
4.0000 g | PACK | Freq: Two times a day (BID) | ORAL | Status: DC
  Administered 2016-10-14 – 2016-10-18 (×9): 4 g via ORAL
  Filled 2016-10-14 (×10): qty 1

## 2016-10-14 MED ORDER — PROPOFOL 10 MG/ML IV BOLUS
INTRAVENOUS | Status: AC
  Filled 2016-10-14: qty 20

## 2016-10-14 MED ORDER — ROCURONIUM BROMIDE 100 MG/10ML IV SOLN
INTRAVENOUS | Status: DC | PRN
  Administered 2016-10-14: 30 mg via INTRAVENOUS

## 2016-10-14 MED ORDER — CEFAZOLIN SODIUM-DEXTROSE 2-3 GM-% IV SOLR
INTRAVENOUS | Status: DC | PRN
  Administered 2016-10-14: 2 g via INTRAVENOUS

## 2016-10-14 MED ORDER — ACETAMINOPHEN 650 MG RE SUPP: 650.0000 mg | Freq: Four times a day (QID) | RECTAL | Status: DC | PRN

## 2016-10-14 MED ORDER — GLYCOPYRROLATE 0.2 MG/ML IJ SOLN
INTRAMUSCULAR | Status: DC | PRN
  Administered 2016-10-14: 0.2 mg via INTRAVENOUS

## 2016-10-14 MED ORDER — MORPHINE SULFATE (PF) 4 MG/ML IV SOLN
2.0000 mg | INTRAVENOUS | Status: DC | PRN
  Administered 2016-10-14: 2 mg via INTRAVENOUS
  Filled 2016-10-14: qty 1

## 2016-10-14 MED ORDER — DOCUSATE SODIUM 100 MG PO CAPS
100.0000 mg | ORAL_CAPSULE | Freq: Two times a day (BID) | ORAL | Status: DC
  Administered 2016-10-15 – 2016-10-17 (×4): 100 mg via ORAL
  Filled 2016-10-14 (×9): qty 1

## 2016-10-14 MED ORDER — LACTATED RINGERS IV SOLN
INTRAVENOUS | Status: DC | PRN
  Administered 2016-10-14: 01:00:00 via INTRAVENOUS

## 2016-10-14 MED ORDER — METHOCARBAMOL 500 MG PO TABS
500.0000 mg | ORAL_TABLET | Freq: Four times a day (QID) | ORAL | Status: DC | PRN
  Administered 2016-10-14 – 2016-10-18 (×7): 500 mg via ORAL
  Filled 2016-10-14 (×10): qty 1

## 2016-10-14 MED ORDER — KETOROLAC TROMETHAMINE 15 MG/ML IJ SOLN
7.5000 mg | Freq: Four times a day (QID) | INTRAMUSCULAR | Status: AC
  Administered 2016-10-14 (×4): 7.5 mg via INTRAVENOUS
  Filled 2016-10-14 (×3): qty 1

## 2016-10-14 MED ORDER — VITAMIN D (ERGOCALCIFEROL) 1.25 MG (50000 UNIT) PO CAPS
50000.0000 [IU] | ORAL_CAPSULE | ORAL | Status: DC
  Administered 2016-10-16: 50000 [IU] via ORAL
  Filled 2016-10-14: qty 1

## 2016-10-14 MED ORDER — FENTANYL CITRATE (PF) 250 MCG/5ML IJ SOLN
INTRAMUSCULAR | Status: AC
  Filled 2016-10-14: qty 5

## 2016-10-14 MED ORDER — CELECOXIB 100 MG PO CAPS
100.0000 mg | ORAL_CAPSULE | Freq: Two times a day (BID) | ORAL | Status: DC
  Administered 2016-10-16 – 2016-10-18 (×5): 100 mg via ORAL
  Filled 2016-10-14 (×6): qty 1

## 2016-10-14 MED ORDER — POLYETHYLENE GLYCOL 3350 17 G PO PACK: 17.0000 g | PACK | Freq: Every day | ORAL | Status: DC | PRN

## 2016-10-14 MED ORDER — KETOROLAC TROMETHAMINE 15 MG/ML IJ SOLN
INTRAMUSCULAR | Status: AC
  Filled 2016-10-14: qty 1

## 2016-10-14 MED ORDER — GABAPENTIN 300 MG PO CAPS
300.0000 mg | ORAL_CAPSULE | Freq: Three times a day (TID) | ORAL | Status: DC
  Administered 2016-10-14 – 2016-10-18 (×13): 300 mg via ORAL
  Filled 2016-10-14 (×14): qty 1

## 2016-10-14 MED ORDER — FLEET ENEMA 7-19 GM/118ML RE ENEM: 1.0000 | ENEMA | Freq: Once | RECTAL | Status: DC | PRN

## 2016-10-14 MED ORDER — ONDANSETRON HCL 4 MG/2ML IJ SOLN
INTRAMUSCULAR | Status: AC
  Filled 2016-10-14: qty 2

## 2016-10-14 MED ORDER — PHENYLEPHRINE HCL 10 MG/ML IJ SOLN
INTRAVENOUS | Status: DC | PRN
  Administered 2016-10-14: 10 ug/min via INTRAVENOUS

## 2016-10-14 MED ORDER — ONDANSETRON HCL 4 MG/2ML IJ SOLN
4.0000 mg | Freq: Once | INTRAMUSCULAR | Status: AC
  Administered 2016-10-14: 4 mg via INTRAVENOUS

## 2016-10-14 MED ORDER — FLUTICASONE PROPIONATE 50 MCG/ACT NA SUSP
1.0000 | Freq: Two times a day (BID) | NASAL | Status: DC
  Administered 2016-10-14 – 2016-10-17 (×7): 1 via NASAL
  Filled 2016-10-14: qty 16

## 2016-10-14 MED ORDER — CEFAZOLIN SODIUM-DEXTROSE 1-4 GM/50ML-% IV SOLN
1.0000 g | Freq: Four times a day (QID) | INTRAVENOUS | Status: AC
  Administered 2016-10-14 (×3): 1 g via INTRAVENOUS
  Filled 2016-10-14 (×3): qty 50

## 2016-10-14 NOTE — Progress Notes (Signed)
Dr Ophelia Charter services paged to notify of bladder scan 543 ml. Will informed day shift nurse to follow up with orders

## 2016-10-14 NOTE — Progress Notes (Signed)
Subjective: 1 Day Post-Op Procedure(s) (LRB): Application of external fixator Right lower leg (Right) Patient reports pain as mild to moderate. Leg needs to be elevated. Is NWB   Objective: Vital signs in last 24 hours: Temp:  [97.7 F (36.5 C)-98 F (36.7 C)] 98 F (36.7 C) (08/18 0344) Pulse Rate:  [71-92] 92 (08/18 0344) Resp:  [10-17] 11 (08/18 0315) BP: (91-122)/(43-91) 101/50 (08/18 0344) SpO2:  [92 %-98 %] 94 % (08/18 0344)  Intake/Output from previous day: 08/17 0701 - 08/18 0700 In: 1096.7 [I.V.:1096.7] Out: 50 [Blood:50] Intake/Output this shift: No intake/output data recorded.   Recent Labs  10/13/16 1841  HGB 12.8    Recent Labs  10/13/16 1841  WBC 15.1*  RBC 4.21  HCT 38.0  PLT 318    Recent Labs  10/13/16 1841  NA 139  K 4.3  CL 106  CO2 22  BUN 14  CREATININE 0.77  GLUCOSE 122*  CALCIUM 9.1    Recent Labs  10/13/16 1841  INR 0.89    Neurologically intact Dg Chest 1 View  Result Date: 10/13/2016 CLINICAL DATA:  Fall with leg fracture EXAM: CHEST 1 VIEW COMPARISON:  None. FINDINGS: Elevation of the right hemidiaphragm. No pneumothorax or sizable pleural effusion. No focal airspace consolidation or pulmonary edema. Moderate hiatal hernia. IMPRESSION: No active disease. Electronically Signed   By: Deatra Robinson M.D.   On: 10/13/2016 18:06   Dg Tibia/fibula Right  Result Date: 10/14/2016 CLINICAL DATA:  External fixation EXAM: DG C-ARM 61-120 MIN; RIGHT TIBIA AND FIBULA - 2 VIEW COMPARISON:  10/13/2016 FINDINGS: Total fluoroscopy time was 18 seconds. Two low resolution spot intraoperative views of the right tibia and fibula. Re- demonstrated comminuted and displaced fractures of the distal shafts of the tibia and fibula with decreased angulation compared to preoperative radiographs. IMPRESSION: Intraoperative fluoroscopic assistance provided during external fixation of tibial and fibular fracture Electronically Signed   By: Jasmine Pang  M.D.   On: 10/14/2016 02:13   Dg Tibia/fibula Right  Result Date: 10/13/2016 CLINICAL DATA:  Fall.  Open right tib-fib fracture. EXAM: RIGHT TIBIA AND FIBULA - 2 VIEW COMPARISON:  None. FINDINGS: Comminuted oblique non articular distal shaft right tibia fracture with mild apex medial angulation and 15 mm medial displacement of the dominant distal fracture fragment. Comminuted distal shaft oblique non articular right fibular fracture with mild apex medial angulation and 8-9 mm medial and posterior displacement of the dominant distal fracture fragment, noting a 5 cm posterior butterfly fracture fragment in the right distal fibula. No suspicious focal osseous lesions. Partially visualized right total knee arthroplasty hardware with surgical plate overlying the distal right femur. IMPRESSION: Comminuted displaced angulated non articular distal shaft fractures in the right tibia and fibula as detailed. Electronically Signed   By: Delbert Phenix M.D.   On: 10/13/2016 18:08   Ct Head Wo Contrast  Result Date: 10/13/2016 CLINICAL DATA:  Pain following fall EXAM: CT HEAD WITHOUT CONTRAST TECHNIQUE: Contiguous axial images were obtained from the base of the skull through the vertex without intravenous contrast. COMPARISON:  None. FINDINGS: Brain: There is moderate diffuse atrophy. There is no intracranial mass, hemorrhage, extra-axial fluid collection, or midline shift. There is small vessel disease throughout the centra semiovale bilaterally. Elsewhere gray-white compartments appear normal. No evident acute infarct. Vascular: There is no appreciable hyperdense vessel. There is calcification in each carotid siphon. Skull: The bony calvarium appears intact. Sinuses/Orbits: There is mild mucosal thickening in several ethmoid air cells bilaterally. Other  visualized paranasal sinuses are clear. Orbits appear symmetric bilaterally. Patient has had cataract removals bilaterally. Other: Mastoid air cells are clear. IMPRESSION:  Atrophy with small vessel disease throughout much of the periventricular white matter. No intracranial mass, hemorrhage, or extra-axial fluid collection. No evident acute infarct. There are areas of arterial vascular calcification. Areas of ethmoid sinus disease noted. Electronically Signed   By: Bretta Bang III M.D.   On: 10/13/2016 18:10   Dg C-arm 1-60 Min  Result Date: 10/14/2016 CLINICAL DATA:  External fixation EXAM: DG C-ARM 61-120 MIN; RIGHT TIBIA AND FIBULA - 2 VIEW COMPARISON:  10/13/2016 FINDINGS: Total fluoroscopy time was 18 seconds. Two low resolution spot intraoperative views of the right tibia and fibula. Re- demonstrated comminuted and displaced fractures of the distal shafts of the tibia and fibula with decreased angulation compared to preoperative radiographs. IMPRESSION: Intraoperative fluoroscopic assistance provided during external fixation of tibial and fibular fracture Electronically Signed   By: Jasmine Pang M.D.   On: 10/14/2016 02:13    Assessment/Plan: 1 Day Post-Op Procedure(s) (LRB): Application of external fixator Right lower leg (Right) Up with therapy, will be going back to SNF in a few days. NWB due to tib/fib Fx.  Was told by nurse that pt has POA which I was not aware of. ER physician stated pt was competent for consent and was alert , oriented and expressed understanding of Open fracture of tib /fib with bone sticking out thru skin with risk of infection and need for surgery. Patient signed consent. Tried phone # listed in chart and it states " not in service".   If family calls they can reach me at cell [phone]. Will talk to nurse and see if any other numbers are available.     Eldred Manges 10/14/2016, 12:38 PM

## 2016-10-14 NOTE — Anesthesia Postprocedure Evaluation (Signed)
Anesthesia Post Note  Patient: Ariana Patton  Procedure(s) Performed: Procedure(s) (LRB): Application of external fixator Right lower leg (Right)     Patient location during evaluation: PACU Anesthesia Type: General Level of consciousness: awake and alert Pain management: pain level controlled Vital Signs Assessment: post-procedure vital signs reviewed and stable Respiratory status: spontaneous breathing, nonlabored ventilation, respiratory function stable and patient connected to nasal cannula oxygen Cardiovascular status: blood pressure returned to baseline and stable Postop Assessment: no signs of nausea or vomiting Anesthetic complications: no    Last Vitals:  Vitals:   10/14/16 0300 10/14/16 0315  BP: (!) 107/54 (!) 107/57  Pulse: 89 89  Resp: 10 11  Temp:  36.6 C  SpO2: 94% 94%    Last Pain:  Vitals:   10/13/16 2040  PainSc: 8         RLE Motor Response: Purposeful movement;Responds to commands (10/14/16 0315) RLE Sensation: Full sensation (10/14/16 0315)      Kennieth Rad

## 2016-10-14 NOTE — Evaluation (Signed)
Physical Therapy Evaluation Patient Details Name: Ariana Patton MRN: [id number] DOB: 1945/03/06 Today's Date: 10/14/2016   History of Present Illness  Patient fell at her nursing home and suffered a tib fib mid shaft fx. She hasd an external fixation perfromed on 10/13/2016. She reports her legs just give out on her sometimes. PMH includes multiple scleorosis; dementia (per daughter but not in chart) depression,   Clinical Impression  Patient requires significant assist for all transfers. She was able to transfer over to the chair with max +2. She is a high fall risk. She will require rehabilitation at a SNF. Acute therapy will continue to follow her.     Follow Up Recommendations SNF    Equipment Recommendations  None recommended by PT    Recommendations for Other Services Rehab consult     Precautions / Restrictions Precautions Precautions: Fall Required Braces or Orthoses:  (has external fixator ) Restrictions Weight Bearing Restrictions: Yes RLE Weight Bearing: Non weight bearing      Mobility  Bed Mobility Overal bed mobility: Needs Assistance Bed Mobility: Supine to Sit     Supine to sit: +2 for physical assistance;Max assist     General bed mobility comments: Patient required max a to the edge of the bed then required max a to remain sitting at the edge of the bed. She leaned to her left side. She was able to correct with cuing but quickly wnet back to leaning.    Transfers Overall transfer level: Needs assistance Equipment used: Rolling walker (2 wheeled) (unable to use though ) Transfers: Sit to/from [employer] Sit to Stand: Max assist;+2 physical assistance Stand pivot transfers: +2 physical assistance;Max assist       General transfer comment: Patient unable to stand with walker.  Therapy kept foot under her right lfoot and cued to to put no weight through it. She did not put weight through her right foot with max a +2 stand pivot transfer to  the chair. She reported some pain but it did not appear to be more then minor pain.   Ambulation/Gait                Stairs            Wheelchair Mobility    Modified Rankin (Stroke Patients Only)       Balance                                             Pertinent Vitals/Pain Pain Assessment: Faces Faces Pain Scale: Hurts little more Pain Location: Patient did not show significnat signs of pain  Pain Descriptors / Indicators: Aching Pain Intervention(s): Limited activity within patient's tolerance;Premedicated before session;Monitored during session;Repositioned    Home Living Family/patient expects to be discharged to:: Skilled nursing facility                 Additional Comments: Patient has been living at a SNF per patient because of MS flair up     Prior Function Level of Independence: Independent with assistive device(s)         Comments: Patient states she could walk at times but at other times she used a wheelchair for mobility.      Hand Dominance        Extremity/Trunk Assessment   Upper Extremity Assessment Upper Extremity Assessment: Generalized weakness    Lower Extremity  Assessment Lower Extremity Assessment: Generalized weakness    Cervical / Trunk Assessment Cervical / Trunk Assessment: Kyphotic  Communication   Communication: No difficulties  Cognition Arousal/Alertness: Awake/alert Behavior During Therapy: WFL for tasks assessed/performed Overall Cognitive Status: Within Functional Limits for tasks assessed                                 General Comments: Patient appeared to be AOx2 she did not realize it was morning but otherwise she was oriented to herself and were she was. Therapy spoke with patients duaghter on the phone who reported she has dementia.       General Comments      Exercises     Assessment/Plan    PT Assessment Patient needs continued PT services  PT Problem  List Decreased strength;Decreased range of motion;Decreased activity tolerance;Decreased balance;Decreased safety awareness;Pain;Decreased mobility;Decreased knowledge of precautions       PT Treatment Interventions DME instruction;Gait training;Functional mobility training;Therapeutic activities;Therapeutic exercise;Neuromuscular re-education;Patient/family education    PT Goals (Current goals can be found in the Care Plan section)  Acute Rehab PT Goals Patient Stated Goal: unable to state goal  PT Goal Formulation: With patient    Frequency Min 5X/week   Barriers to discharge        Co-evaluation               AM-PAC PT "6 Clicks" Daily Activity  Outcome Measure Difficulty turning over in bed (including adjusting bedclothes, sheets and blankets)?: A Lot Difficulty moving from lying on back to sitting on the side of the bed? : A Lot Difficulty sitting down on and standing up from a chair with arms (e.g., wheelchair, bedside commode, etc,.)?: A Lot Help needed moving to and from a bed to chair (including a wheelchair)?: A Lot Help needed walking in hospital room?: Total Help needed climbing 3-5 steps with a railing? : Total 6 Click Score: 10    End of Session Equipment Utilized During Treatment: Gait belt Activity Tolerance: Patient limited by fatigue;Patient limited by pain Patient left: in chair;with chair alarm set;with call bell/phone within reach (leg put on pillow and fresh ice applied ) Nurse Communication: Mobility status PT Visit Diagnosis: Unsteadiness on feet (R26.81);Repeated falls (R29.6);History of falling (Z91.81);Difficulty in walking, not elsewhere classified (R26.2);Pain Pain - Right/Left: Right Pain - part of body: Leg    Time: 0943-1000 PT Time Calculation (min) (ACUTE ONLY): 17 min   Charges:   PT Evaluation $PT Eval High Complexity: 1 High     PT G Codes:         Dessie Coma PT DPT  10/14/2016, 1:45 PM

## 2016-10-14 NOTE — Anesthesia Preprocedure Evaluation (Incomplete)
Anesthesia Evaluation Anesthesia Physical Anesthesia Plan Anesthesia Quick Evaluation  

## 2016-10-14 NOTE — Interval H&P Note (Signed)
History and Physical Interval Note:  10/14/2016 12:33 AM  Ariana Patton  has presented today for surgery, with the diagnosis of Right Open Tib/Fib fracture  The various methods of treatment have been discussed with the patient and family. After consideration of risks, benefits and other options for treatment, the patient has consented to  Procedure(s): Application of external fixator Right lower leg (Right) as a surgical intervention .  The patient's history has been reviewed, patient examined, no change in status, stable for surgery.  I have reviewed the patient's chart and labs.  Questions were answered to the patient's satisfaction.     Eldred Manges

## 2016-10-14 NOTE — Anesthesia Preprocedure Evaluation (Signed)
Anesthesia Evaluation  Patient identified by MRN, date of birth, ID band Patient awake    Reviewed: Allergy & Precautions, NPO status , Patient's Chart, lab work & pertinent test results  Airway Mallampati: II  TM Distance: >3 FB Neck ROM: Full    Dental  (+) Dental Advisory Given   Pulmonary neg pulmonary ROS,    breath sounds clear to auscultation       Cardiovascular negative cardio ROS   Rhythm:Regular Rate:Normal     Neuro/Psych Multiple sclerosis    GI/Hepatic negative GI ROS, Neg liver ROS,   Endo/Other  negative endocrine ROS  Renal/GU negative Renal ROS     Musculoskeletal   Abdominal   Peds  Hematology negative hematology ROS (+)   Anesthesia Other Findings   Reproductive/Obstetrics                             Lab Results  Component Value Date   WBC 15.1 (H) 10/13/2016   HGB 12.8 10/13/2016   HCT 38.0 10/13/2016   MCV 90.3 10/13/2016   PLT 318 10/13/2016   Lab Results  Component Value Date   CREATININE 0.77 10/13/2016   BUN 14 10/13/2016   NA 139 10/13/2016   K 4.3 10/13/2016   CL 106 10/13/2016   CO2 22 10/13/2016    Anesthesia Physical Anesthesia Plan  ASA: III  Anesthesia Plan: General   Post-op Pain Management:    Induction: Intravenous  PONV Risk Score and Plan: 3 and Ondansetron, Dexamethasone and Treatment may vary due to age or medical condition  Airway Management Planned: Oral ETT  Additional Equipment:   Intra-op Plan:   Post-operative Plan: Extubation in OR  Informed Consent: I have reviewed the patients History and Physical, chart, labs and discussed the procedure including the risks, benefits and alternatives for the proposed anesthesia with the patient or authorized representative who has indicated his/her understanding and acceptance.   Dental advisory given  Plan Discussed with: CRNA  Anesthesia Plan Comments:          Anesthesia Quick Evaluation

## 2016-10-14 NOTE — Progress Notes (Signed)
OT Cancellation Note  Patient Details Name: Ariana Patton MRN: [id number] DOB: 10-22-1945   Cancelled Treatment:    Reason Eval/Treat Not Completed: Other (comment): OT screened. Pt with Medicare residing at [facility] and planning to return to SNF. All OT needs may be met at next venue of care. Acute OT will sign off.   Norman Herrlich, MS OTR/L  Pager: [phone]   Norman Herrlich 10/14/2016, 3:37 PM

## 2016-10-14 NOTE — Anesthesia Procedure Notes (Signed)
Procedure Name: Intubation Date/Time: 10/14/2016 1:58 AM Performed by: Isidore Moos A Patient Re-evaluated:Patient Re-evaluated prior to induction Oxygen Delivery Method: Circle system utilized Preoxygenation: Pre-oxygenation with 100% oxygen Induction Type: IV induction Ventilation: Mask ventilation without difficulty Laryngoscope Size: Miller and 2 Grade View: Grade I Tube type: Oral Tube size: 7.0 mm Number of attempts: 1 Airway Equipment and Method: Patient positioned with wedge pillow and Stylet Placement Confirmation: ETT inserted through vocal cords under direct vision,  positive ETCO2 and breath sounds checked- equal and bilateral Secured at: 22 cm Tube secured with: Tape Dental Injury: Teeth and Oropharynx as per pre-operative assessment

## 2016-10-14 NOTE — Progress Notes (Signed)
Physical Therapy Treatment Patient Details Name: Ariana Patton MRN: [id number] DOB: Mar 20, 1945 Today's Date: 10/14/2016    History of Present Illness Patient fell at her nursing home and suffered a tib fib mid shaft fx. She hasd an external fixation perfromed on 10/13/2016. She reports her legs just give out on her sometimes. PMH includes multiple scleorosis; dementia (per daughter but not in chart) depression,     PT Comments    Patient required max a +2 for transfers again to get back to bed. She was able to comply with non-weight bearing. She showed signs of pain but it did not seem to be bad. Of note, patient asked therapy to talk to the patients daughter. Daughter asked to speak with nurse about health care POA. Nursing notified.    Follow Up Recommendations  SNF     Equipment Recommendations  None recommended by PT    Recommendations for Other Services Rehab consult     Precautions / Restrictions Precautions Precautions: Fall Required Braces or Orthoses:  (has external fixator ) Restrictions Weight Bearing Restrictions: Yes RLE Weight Bearing: Non weight bearing    Mobility  Bed Mobility Overal bed mobility: Needs Assistance Bed Mobility: Sit to Supine     Supine to sit: +2 for physical assistance;Max assist     General bed mobility comments: Max A to get both legs back into bed. Therapy assited legs while CNA assisted upper body  Transfers Overall transfer level: Needs assistance Equipment used: Rolling walker (2 wheeled) (unable to use though ) Transfers: Sit to/from [employer] Sit to Stand: Max assist;+2 physical assistance Stand pivot transfers: +2 physical assistance;Max assist       General transfer comment: Max a stand pivot transfer. Good adherance to non weight bearing again.   Ambulation/Gait                 Stairs            Wheelchair Mobility    Modified Rankin (Stroke Patients Only)       Balance                                             Cognition Arousal/Alertness: Awake/alert Behavior During Therapy: WFL for tasks assessed/performed Overall Cognitive Status: Within Functional Limits for tasks assessed                                 General Comments: Patient appeared to be AOx2 she did not realize it was morning but otherwise she was oriented to herself and were she was. Therapy spoke with patients duaghter on the phone who reported she has dementia.       Exercises      General Comments        Pertinent Vitals/Pain Pain Assessment: Faces Faces Pain Scale: Hurts little more Pain Location: Patient did not show significnat signs of pain  Pain Descriptors / Indicators: Aching Pain Intervention(s): Limited activity within patient's tolerance;Premedicated before session;Monitored during session;Repositioned    Home Living Family/patient expects to be discharged to:: Skilled nursing facility               Additional Comments: Patient has been living at a SNF per patient because of MS flair up     Prior Function Level of Independence: Independent with assistive device(s)  Comments: Patient states she could walk at times but at other times she used a wheelchair for mobility.    PT Goals (current goals can now be found in the care plan section) Acute Rehab PT Goals Patient Stated Goal: unable to state goal  PT Goal Formulation: With patient    Frequency    Min 5X/week      PT Plan Current plan remains appropriate    Co-evaluation              AM-PAC PT "6 Clicks" Daily Activity  Outcome Measure  Difficulty turning over in bed (including adjusting bedclothes, sheets and blankets)?: A Lot Difficulty moving from lying on back to sitting on the side of the bed? : A Lot Difficulty sitting down on and standing up from a chair with arms (e.g., wheelchair, bedside commode, etc,.)?: A Lot Help needed moving to and from a bed  to chair (including a wheelchair)?: A Lot Help needed walking in hospital room?: Total Help needed climbing 3-5 steps with a railing? : Total 6 Click Score: 10    End of Session Equipment Utilized During Treatment: Gait belt Activity Tolerance: Patient limited by fatigue;Patient limited by pain Patient left: in chair;with chair alarm set;with call bell/phone within reach (leg put on pillow and fresh ice applied ) Nurse Communication: Mobility status PT Visit Diagnosis: Unsteadiness on feet (R26.81);Repeated falls (R29.6);History of falling (Z91.81);Difficulty in walking, not elsewhere classified (R26.2);Pain Pain - Right/Left: Right Pain - part of body: Leg     Time: 1140-1154 PT Time Calculation (min) (ACUTE ONLY): 14 min  Charges:  $Therapeutic Activity: 8-22 mins                    G Codes:         Dessie Coma PT DPT  10/14/2016, 1:50 PM

## 2016-10-14 NOTE — Op Note (Signed)
Preop Diagnosis: right open tib-fib shaft fracture grade II  Postop diagnosis: Same  Procedure: Sharp excisional debridement of open right tib-fib shaft fracture. Excisional debridement of skin subcutaneous tissue muscle and bone. External fixator application  Surgeon: Annell Greening M.D.  Anesthesia general  Tourniquet none.  Brief history 71 year old female with MS has been a nursing home for 4 months. 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 Patient had an unobserved fall with an open tib-fib fracture angulation displacement and comminution.  Procedure after informed consent Center prepping draping Ancef prophylaxis timeout procedure leg was scrubbed with Betadine scrub and solution. Usual extremity sheets drapes bone foam was all used. [place]. Copious lavage was performed after extending the 2 cm anterolateral opening which had fibular the proximal shaft sticking out through the skin. Sharp excisional debridement of skin subtendinous tissue some muscle and also debridement of the bone with a Ronjair was performed. Once the meticulous cleaning was obtained to bicortical half pins are placed proximal third of the tibia checked under C-arm to make sure they just penetrated the posterior cortex. Under C-arm visualization stab incision was made and the calcaneal pin was inserted under fluoroscopy. Leg was reduced checking AP and lateral. Due to the comminution the distal fragment wanted to displace medially. Hand pressure was used help reduce fragments none external fixator was tightened down with final spot pictures taken under C-arm. There is comminution of the fibula as well. Skin that had been extended was reapproximated with 2-0 nylon fibula was reduced underneath some muscle for coverage. Pins were checked and skin was released as needed Xeroform 4 x 4's ABDs the Curlex and  Ace wrap was applied. Calcaneal pin was cut pin protectors applied and patient was transferred recovery. The anterior compartment proximally was the slightly firm but was not hard patient had normal sensation and only just above the fracture site was a somewhat the firm. Peroneal compartment was normal. Not need to be released. Bone was out to length was satisfactory overall alignment AP and lateral. Patient's transferred recovery room.

## 2016-10-14 NOTE — Transfer of Care (Signed)
Immediate Anesthesia Transfer of Care Note  Patient: Ariana Patton  Procedure(s) Performed: Procedure(s): Application of external fixator Right lower leg (Right)  Patient Location: PACU  Anesthesia Type:General  Level of Consciousness: sedated  Airway & Oxygen Therapy: Patient Spontanous Breathing and Patient connected to nasal cannula oxygen  Post-op Assessment: Report given to RN and Post -op Vital signs reviewed and stable  Post vital signs: Reviewed and stable  Last Vitals:  Vitals:   10/14/16 0015 10/14/16 0030  BP: (!) 106/46 119/62  Pulse: 81 82  Resp: 16 17  SpO2: 95% 95%    Last Pain:  Vitals:   10/13/16 2040  PainSc: 8          Complications: No apparent anesthesia complications

## 2016-10-14 NOTE — Brief Op Note (Signed)
10/13/2016 - 10/14/2016  2:12 AM  PATIENT:  Aldona Bar  71 y.o. female  PRE-OPERATIVE DIAGNOSIS:  Right Open Tib/Fib fracture  POST-OPERATIVE DIAGNOSIS:  Right Open Tib/Fib fracture  PROCEDURE:  Procedure(s): Application of external fixator Right lower leg (Right)  Sharp excisional debridment of open fracture  SURGEON:  Surgeon(s) and Role:    * Eldred Manges, MD - Primary  PHYSICIAN ASSISTANT:   ASSISTANTS: none   ANESTHESIA:   general  EBL:  No intake/output data recorded.  BLOOD ADMINISTERED:none  DRAINS: none   LOCAL MEDICATIONS USED:  NONE  SPECIMEN:  No Specimen  DISPOSITION OF SPECIMEN:  N/A  COUNTS:  YES  TOURNIQUET:  * No tourniquets in log *  DICTATION: .Dragon Dictation  PLAN OF CARE: Admit to inpatient   PATIENT DISPOSITION:  PACU - hemodynamically stable.   Delay start of Pharmacological VTE agent (>24hrs) due to surgical blood loss or risk of bleeding: yes

## 2016-10-15 LAB — URINALYSIS, ROUTINE W REFLEX MICROSCOPIC
BILIRUBIN URINE: NEGATIVE
GLUCOSE, UA: NEGATIVE mg/dL
KETONES UR: NEGATIVE mg/dL
NITRITE: NEGATIVE
PH: 5 (ref 5.0–8.0)
Protein, ur: NEGATIVE mg/dL
SPECIFIC GRAVITY, URINE: 1.015 (ref 1.005–1.030)

## 2016-10-15 LAB — BASIC METABOLIC PANEL
ANION GAP: 6 (ref 5–15)
BUN: 9 mg/dL (ref 6–20)
CHLORIDE: 103 mmol/L (ref 101–111)
CO2: 25 mmol/L (ref 22–32)
Calcium: 8.3 mg/dL — ABNORMAL LOW (ref 8.9–10.3)
Creatinine, Ser: 0.89 mg/dL (ref 0.44–1.00)
GFR calc non Af Amer: >60 mL/min (ref >60)
Glucose, Bld: 87 mg/dL (ref 65–99)
POTASSIUM: 4.1 mmol/L (ref 3.5–5.1)
SODIUM: 134 mmol/L — AB (ref 135–145)

## 2016-10-15 LAB — CBC
HEMATOCRIT: 30.3 % — AB (ref 36.0–46.0)
HEMOGLOBIN: 10 g/dL — AB (ref 12.0–15.0)
MCH: 29.9 pg (ref 26.0–34.0)
MCHC: 33 g/dL (ref 30.0–36.0)
MCV: 90.7 fL (ref 78.0–100.0)
Platelets: 243 10*3/uL (ref 150–400)
RBC: 3.34 MIL/uL — ABNORMAL LOW (ref 3.87–5.11)
RDW: 13.1 % (ref 11.5–15.5)
WBC: 9 10*3/uL (ref 4.0–10.5)

## 2016-10-15 NOTE — Progress Notes (Addendum)
   Subjective: 2 Days Post-Op Procedure(s) (LRB): Application of external fixator Right lower leg (Right) Patient reports pain as moderate.    Objective: Vital signs in last 24 hours: Temp:  [97.6 F (36.4 C)-99 F (37.2 C)] 99 F (37.2 C) (08/19 0534) Pulse Rate:  [75-81] 81 (08/19 0534) Resp:  [16] 16 (08/18 1346) BP: (85-112)/(36-49) 98/48 (08/19 0534) SpO2:  [94 %-97 %] 97 % (08/19 0534)  Intake/Output from previous day: 08/18 0701 - 08/19 0700 In: -  Out: 1500 [Urine:1500] Intake/Output this shift: No intake/output data recorded.   Recent Labs  10/13/16 1841  HGB 12.8    Recent Labs  10/13/16 1841  WBC 15.1*  RBC 4.21  HCT 38.0  PLT 318    Recent Labs  10/13/16 1841  NA 139  K 4.3  CL 106  CO2 22  BUN 14  CREATININE 0.77  GLUCOSE 122*  CALCIUM 9.1    Recent Labs  10/13/16 1841  INR 0.89    dressing removed. new dressing applied. no drainage. sensation and pulse intact .  compartment firm anterior. Posterior soft. Lateral some swelling.  Anteromedial echymosis and fx blisters.  No results found.  Assessment/Plan: 2 Days Post-Op Procedure(s) (LRB): Application of external fixator Right lower leg (Right) Plan:  Max elevation due to fx blisters and compartment swelling. NEED FL-2  Eldred Manges 10/15/2016, 10:57 AM

## 2016-10-16 NOTE — Progress Notes (Signed)
Physical Therapy Treatment Patient Details Name: Ariana Patton MRN: [id number] DOB: 24-Mar-1945 Today's Date: 10/16/2016    History of Present Illness Patient fell at her nursing home and suffered a tib fib mid shaft fx. She hasd an external fixation perfromed on 10/13/2016. She reports her legs just give out on her sometimes. PMH includes multiple scleorosis; dementia (per daughter but not in chart) depression,     PT Comments    Pt performed OOB transfer to drop arm recliner via lateral scoot.  Pt able to assist minimally to support upper trunk control during transfer.  Pt agreeable and followed directions well.  Plan for SNF remains appropriate at this time.      Follow Up Recommendations  SNF     Equipment Recommendations  None recommended by PT    Recommendations for Other Services Rehab consult     Precautions / Restrictions Precautions Precautions: Fall Restrictions Weight Bearing Restrictions: Yes RLE Weight Bearing: Non weight bearing    Mobility  Bed Mobility Overal bed mobility: Needs Assistance Bed Mobility: Supine to Sit     Supine to sit: Max assist;+2 for physical assistance     General bed mobility comments: Max assist to advance LEs to edge of bed, scoot hips to edge of bed and elevate trunk into sitting.   Transfers Overall transfer level: Needs assistance Equipment used:  (used chux pad and drop arm recliner to scoot from bed to chair.  ) Transfers: Lateral/Scoot Transfers          Lateral/Scoot Transfers: Max assist;+2 physical assistance General transfer comment: Cues for upper trunk control while PTA and RN assisted patient from bed to chair.  Once in chair cues for trunk control forward to allow for scoot back into recliner chair.    Ambulation/Gait Ambulation/Gait assistance:  (unable)               Stairs            Wheelchair Mobility    Modified Rankin (Stroke Patients Only)       Balance Overall balance assessment:  Needs assistance   Sitting balance-Leahy Scale: Poor Sitting balance - Comments: Posterior lean back toward bed once sitting, trunk fatigues quickly.       Standing balance-Leahy Scale: Zero Standing balance comment: Unable to stand today during tx.                              Cognition Arousal/Alertness: Awake/alert Behavior During Therapy: WFL for tasks assessed/performed Overall Cognitive Status: Within Functional Limits for tasks assessed                                 General Comments: Baseline demetia per daughter      Exercises      General Comments        Pertinent Vitals/Pain Pain Assessment: Faces Faces Pain Scale: Hurts little more Pain Location: Patient did not show significnat signs of pain  Pain Descriptors / Indicators: Aching Pain Intervention(s): Monitored during session;Repositioned    Home Living                      Prior Function            PT Goals (current goals can now be found in the care plan section) Acute Rehab PT Goals Patient Stated Goal: unable to state goal  Progress towards PT goals: Progressing toward goals    Frequency    Min 5X/week      PT Plan Current plan remains appropriate    Co-evaluation              AM-PAC PT "6 Clicks" Daily Activity  Outcome Measure  Difficulty turning over in bed (including adjusting bedclothes, sheets and blankets)?: Unable Difficulty moving from lying on back to sitting on the side of the bed? : Unable Difficulty sitting down on and standing up from a chair with arms (e.g., wheelchair, bedside commode, etc,.)?: Unable Help needed moving to and from a bed to chair (including a wheelchair)?: Total Help needed walking in hospital room?: Total Help needed climbing 3-5 steps with a railing? : Total 6 Click Score: 6    End of Session   Activity Tolerance: Patient limited by fatigue;Patient limited by pain Patient left: in chair;with chair alarm  set;with call bell/phone within reach Nurse Communication: Mobility status (informed nursing to use drop arm chair side for lateral scoot back to bed.  ) PT Visit Diagnosis: Unsteadiness on feet (R26.81);Repeated falls (R29.6);History of falling (Z91.81);Difficulty in walking, not elsewhere classified (R26.2);Pain Pain - Right/Left: Right Pain - part of body: Leg     Time: 1715-1739 PT Time Calculation (min) (ACUTE ONLY): 24 min  Charges:  $Therapeutic Activity: 23-37 mins                    G Codes:       [name], PTA pager [phone]    [name] 10/16/2016, 5:44 PM

## 2016-10-17 NOTE — Progress Notes (Signed)
Physical Therapy Treatment Patient Details Name: Ariana Patton MRN: [id number] DOB: [birth year] Today's Date: 10/17/2016    History of Present Illness Patient fell at her nursing home and suffered a tib fib mid shaft fx. She hasd an external fixation perfromed on 10/13/2016. She reports her legs just give out on her sometimes. PMH includes multiple scleorosis; dementia (per daughter but not in chart) depression,     PT Comments    Patient progressing slowly due to limited balance/trunk control, increased LE tone/spasticity and poor overall activity tolerance.  She will need SNF level rehab at d/c.  PT to follow acutely.    Follow Up Recommendations  SNF     Equipment Recommendations  None recommended by PT    Recommendations for Other Services       Precautions / Restrictions Precautions Precautions: Fall Required Braces or Orthoses: Other Brace/Splint Other Brace/Splint: ex fix R tib fib Restrictions RLE Weight Bearing: Non weight bearing    Mobility  Bed Mobility Overal bed mobility: Needs Assistance Bed Mobility: Supine to Sit     Supine to sit: Max assist;+2 for physical assistance     General bed mobility comments: LE tone noted so assist for LE's off bed and to lift trunk and scoot to EOB  Transfers Overall transfer level: Needs assistance   Transfers: Lateral/Scoot Transfers Sit to Stand: Max assist;+2 physical assistance         General transfer comment: assist to keep R foot NWB and +2 to scoot on bed pad to drop arm recliner, pt cued to use hands some for balance, scooting, but unable to help much  Ambulation/Gait                 Stairs            Wheelchair Mobility    Modified Rankin (Stroke Patients Only)       Balance Overall balance assessment: Needs assistance Sitting-balance support: Bilateral upper extremity supported Sitting balance-Leahy Scale: Poor Sitting balance - Comments: sat EOB approx 8 minutes working on finding  balance point with UE support, able to sit with S about 1.5 min with bilateral UE support and cues for anterior and L weight shift                                    Cognition Arousal/Alertness: Awake/alert Behavior During Therapy: Flat affect Overall Cognitive Status: Within Functional Limits for tasks assessed                                 General Comments: Baseline demetia per daughter      Exercises      General Comments        Pertinent Vitals/Pain Pain Score: 3  Pain Location: R LE Pain Descriptors / Indicators: Operative site guarding Pain Intervention(s): Monitored during session;Premedicated before session    Home Living                      Prior Function            PT Goals (current goals can now be found in the care plan section) Progress towards PT goals: Progressing toward goals (very slowly)    Frequency    Min 3X/week      PT Plan Current plan remains appropriate;Frequency needs to be updated  Co-evaluation              AM-PAC PT "6 Clicks" Daily Activity  Outcome Measure  Difficulty turning over in bed (including adjusting bedclothes, sheets and blankets)?: Unable Difficulty moving from lying on back to sitting on the side of the bed? : Unable Difficulty sitting down on and standing up from a chair with arms (e.g., wheelchair, bedside commode, etc,.)?: Unable Help needed moving to and from a bed to chair (including a wheelchair)?: Total Help needed walking in hospital room?: Total Help needed climbing 3-5 steps with a railing? : Total 6 Click Score: 6    End of Session Equipment Utilized During Treatment: Gait belt Activity Tolerance: Patient limited by fatigue;No increased pain Patient left: in chair;with call bell/phone within reach;with chair alarm set Nurse Communication: Mobility status PT Visit Diagnosis: Unsteadiness on feet (R26.81);Repeated falls (R29.6);History of falling  (Z91.81);Difficulty in walking, not elsewhere classified (R26.2);Pain Pain - Right/Left: Right Pain - part of body: Leg     Time: 1610-9604 PT Time Calculation (min) (ACUTE ONLY): 22 min  Charges:  $Therapeutic Activity: 8-22 mins                    G CodesSheran Lawless, Frederic [phone] 10/17/2016    Elray Mcgregor 10/17/2016, 2:49 PM

## 2016-10-17 NOTE — Clinical Social Work Note (Signed)
Clinical Social Work Assessment  Patient Details  Name: Ariana Patton MRN: [id number] Date of Birth: Jul 04, 1945  Date of referral:  10/17/16               Reason for consult:  Facility Placement                Permission sought to share information with:  Facility Art therapist granted to share information::  Yes, Verbal Permission Granted  Name::     son, Jamirra Curnow  Agency::  [facility]  Relationship::     Contact Information:     Housing/Transportation Living arrangements for the past 2 months:  [place] of Information:  Patient Patient Interpreter Needed:  None Criminal Activity/Legal Involvement Pertinent to Current Situation/Hospitalization:  No - Comment as needed Significant Relationships:  Adult Children Lives with:  Facility Resident Do you feel safe going back to the place where you live?  Yes Need for family participation in patient care:  Yes (Comment)  Care giving concerns:  Patient from [facility] SNF and has been there for 4 months for short term rehab and would like to return. Prior to the SNF placement she indicated she was in [place]. Pt stated she fell at the SNF. Pt not safe to return to [place] and will need short term rehab.  Social Worker assessment / plan:  CSW met with patient at bedside to discuss clinical team's DC recommendation for SNF. CSW explained her role and the SNF plan. Pt familiar with the SNF process as she is from SNF. Pt wants to return to [facility]. CSW called and left message for admission staff.  FL2 pending as possible sx. Passr confirmed#[phone].   Employment status:  Retired Forensic scientist:  Medicare PT Recommendations:    Information / Referral to community resources:  [place]  Patient/Family's Response to care:  Patient appreciative of CSW assistance. Patient reports no issues or concerns.  Patient/Family's Understanding of  and Emotional Response to Diagnosis, Current Treatment, and Prognosis:  Patient has good understanding of diagnosis, current treatment and prognosis and plans to return to SNF. Pt hopeful that she will return to baseline. No issues or concerns identified at this time.  Emotional Assessment Appearance:  Appears stated age Attitude/Demeanor/Rapport:   (Cooperative) Affect (typically observed):  Accepting, Appropriate Orientation:  Oriented to Self, Oriented to Place, Oriented to  Time, Oriented to Situation Alcohol / Substance use:  Not Applicable Psych involvement (Current and /or in the community):  No (Comment)  Discharge Needs  Concerns to be addressed:  Care Coordination Readmission within the last 30 days:  No Current discharge risk:  Dependent with Mobility, Physical Impairment Barriers to Discharge:  No Barriers Identified   Normajean Baxter, LCSW 10/17/2016, 11:56 AM

## 2016-10-17 NOTE — Progress Notes (Signed)
Physical Therapy Treatment Patient Details Name: Ariana Patton MRN: [id number] DOB: 06-10-1945 Today's Date: 10/17/2016    History of Present Illness Patient fell at her nursing home and suffered a tib fib mid shaft fx. She hasd an external fixation perfromed on 10/13/2016. She reports her legs just give out on her sometimes. PMH includes multiple scleorosis; dementia (per daughter but not in chart) depression,     PT Comments    Assisted NT to get pt back to bed due to fatigue.  Patient with limited ability to keep foot NWB so tech held her leg up during scooting transfer.  Feel she is more appropriate for nursing to use lift equipment for back to bed for safety.  Will continue skilled PT during acute stay.   Follow Up Recommendations  SNF     Equipment Recommendations  None recommended by PT    Recommendations for Other Services       Precautions / Restrictions Precautions Precautions: Fall Required Braces or Orthoses: Other Brace/Splint Other Brace/Splint: ex fix R tib fib Restrictions RLE Weight Bearing: Non weight bearing    Mobility  Bed Mobility Overal bed mobility: Needs Assistance Bed Mobility: Sit to Supine     Supine to sit: Max assist;+2 for physical assistance Sit to supine: Max assist;+2 for physical assistance   General bed mobility comments: pt on edge of bed so NT assisted to pull her back into bed on bed pad while PT assisted with bilateral LE's  Transfers Overall transfer level: Needs assistance   Transfers: Lateral/Scoot Transfers Sit to Stand: Max assist;+2 physical assistance        Lateral/Scoot Transfers: Max assist;+2 physical assistance General transfer comment: PT assisted to scoot over to bed while NT assisted to keep R foot NWB  Ambulation/Gait                 Stairs            Wheelchair Mobility    Modified Rankin (Stroke Patients Only)       Balance Overall balance assessment: Needs assistance Sitting-balance  support: Bilateral upper extremity supported Sitting balance-Leahy Scale: Poor Sitting balance - Comments: assist to keep leaning forward while scooting to bed                                    Cognition Arousal/Alertness: Awake/alert Behavior During Therapy: Flat affect Overall Cognitive Status: Within Functional Limits for tasks assessed                                 General Comments: Baseline demetia per daughter      Exercises      General Comments        Pertinent Vitals/Pain Pain Score: 3  Faces Pain Scale: Hurts little more Pain Location: R LE; and L heel Pain Descriptors / Indicators: Operative site guarding Pain Intervention(s): Monitored during session;Repositioned    Home Living                      Prior Function            PT Goals (current goals can now be found in the care plan section) Progress towards PT goals: Progressing toward goals    Frequency    Min 3X/week      PT Plan Current plan remains appropriate  Co-evaluation              AM-PAC PT "6 Clicks" Daily Activity  Outcome Measure  Difficulty turning over in bed (including adjusting bedclothes, sheets and blankets)?: Unable Difficulty moving from lying on back to sitting on the side of the bed? : Unable Difficulty sitting down on and standing up from a chair with arms (e.g., wheelchair, bedside commode, etc,.)?: Unable Help needed moving to and from a bed to chair (including a wheelchair)?: Total Help needed walking in hospital room?: Total Help needed climbing 3-5 steps with a railing? : Total 6 Click Score: 6    End of Session Equipment Utilized During Treatment: Gait belt Activity Tolerance: Patient limited by fatigue;No increased pain Patient left: in bed;with call bell/phone within reach Nurse Communication: Mobility status PT Visit Diagnosis: Unsteadiness on feet (R26.81);Repeated falls (R29.6);History of falling  (Z91.81);Difficulty in walking, not elsewhere classified (R26.2);Pain Pain - Right/Left: Right Pain - part of body: Leg     Time: 1345-1400 PT Time Calculation (min) (ACUTE ONLY): 15 min  Charges:  $Therapeutic Activity: 8-22 mins                    G CodesSheran Patton, [place] [phone] 10/17/2016    Ariana Patton 10/17/2016, 5:04 PM

## 2016-10-17 NOTE — Discharge Summary (Addendum)
Physician Discharge Summary  Patient ID: Ariana Patton MRN: [id number] DOB/AGE: 09-03-1945 71 y.o.  Admit date: 10/13/2016 Discharge date: 10/17/2016  Admission Diagnoses:fall with right open tib-fib fracture .             Multiple sclerosis             Postop urinary retentionlikely related to multiple sclerosis history as well as pain medicationand anesthesia from her tibia fracture. Discharge Diagnoses:  Active Problems:   Open fracture of right tibia and fibula   Discharged Condition: stable  Hospital Course: patient's on the skilled nursing facility had an unobserved fall was found to have a grade 2 open tib-fib fracture on the right side. She is brought to the hospitalby EMS and was taken operating room where she went debridement of the open fracture and stabilization with an external fixator. She was mobilized with physical therapy to a chair. She had fracture blisters and swelling which prevented proceeding with plate fixation. She has a total knee arthroplasty perineum place with the tibial component the were a normal tibial nail insertion site would be located. Patient received perioperative antibiotics.Due to the swelling in her leg or foot was elevated at maximum height in the bed and pain was controlled with by oral  pain medicine.   Pin tract Care was started.  Consults: patient was seen by the hospitalist on admission with no severe medical problems noted.  Significant Diagnostic Studies:   Treatments: surgery for sharp excisional debridement of open fracture and stabilization with external fixator.  Discharge Exam: Blood pressure (!) 88/52, pulse 76, temperature 98.4 F (36.9 C), temperature source Oral, resp. rate 15, SpO2 94 %. Physical exam showed she had a mild swelling in the calf. Neurovascular sensation of foot was intact. Foley catheter was placed and will be removed on 10/17/2016.  Disposition: patient to be transferred back still nursing facility once she is  voiding. Due to her multiple sclerosis she's had problems with voiding in the past   Plan: transfer maxillary facility when she is voiding. She'll need keep her foot maximally elevated when she is in the bed in the nursing home. Daily pin track care. Oral pain medication. Also follow-up with Dr. Ophelia Charter in a week. Long-term plan is either plate fixation on the swelling goes down versus cast application until her fracture heals.   Signed: Eldred Manges 10/17/2016, 7:50 AM

## 2016-10-18 MED ORDER — ASPIRIN EC 325 MG PO TBEC: 325.0000 mg | DELAYED_RELEASE_TABLET | Freq: Every day | ORAL | 0 refills | Status: AC

## 2016-10-18 MED ORDER — HYDROCODONE-ACETAMINOPHEN 5-325 MG PO TABS: 1.0000 | ORAL_TABLET | Freq: Four times a day (QID) | ORAL | 0 refills | Status: DC | PRN

## 2016-10-18 NOTE — Care Management Important Message (Signed)
Important Message  Patient Details  Name: Ariana Patton MRN: [id number] Date of Birth: Dec 03, 1945   Medicare Important Message Given:  Yes    [name] [name] 10/18/2016, 11:27 AM

## 2016-10-18 NOTE — NC FL2 (Signed)
MEDICAID FL2 LEVEL OF CARE SCREENING TOOL     IDENTIFICATION  Patient Name: Ariana Patton Birthdate: April 24, 1945 Sex: female Admission Date (Current Location): 10/13/2016  [facility] and [place] Number:  Producer, television/film/video and Address:  [facility] [place], [address], [place], [place] [address]      Provider Number: [id number]  Attending Physician Name and Address:  Eldred Manges, MD  Relative Name and Phone Number:  Armine Rizzolo, [phone]    Current Level of Care: SNF Recommended Level of Care: Skilled Nursing Facility Prior Approval Number:    Date Approved/Denied: 10/18/16 PASRR Number: [id number]  Discharge Plan: SNF    Current Diagnoses: Patient Active Problem List   Diagnosis Date Noted  . Open fracture of right tibia and fibula 10/14/2016    Orientation RESPIRATION BLADDER Height & Weight     Time, Situation, Place, Self  Normal Continent Weight:   Height:     BEHAVIORAL SYMPTOMS/MOOD NEUROLOGICAL BOWEL NUTRITION STATUS      Incontinent Diet (See DC Summary)  AMBULATORY STATUS COMMUNICATION OF NEEDS Skin   Extensive Assist Verbally Surgical wounds (Right Knee Closed Incision with Gauze)                       Personal Care Assistance Level of Assistance  Bathing, Dressing Bathing Assistance: Limited assistance   Dressing Assistance: Limited assistance     Functional Limitations Info             SPECIAL CARE FACTORS FREQUENCY  PT (By licensed PT), OT (By licensed OT)     PT Frequency: 5x week OT Frequency: 5x week            Contractures      Additional Factors Info  Code Status, Allergies, Psychotropic Code Status Info: Full code Allergies Info: No Known Allergies Psychotropic Info: Wellbutrim         Current Medications (10/18/2016):  This is the current hospital active medication list Current Facility-Administered Medications  Medication Dose Route Frequency Provider Last Rate Last Dose  .  acetaminophen (TYLENOL) tablet 650 mg  650 mg Oral Q6H PRN Eldred Manges, MD   650 mg at 10/17/16 0845   Or  . acetaminophen (TYLENOL) suppository 650 mg  650 mg Rectal Q6H PRN Eldred Manges, MD      . bisacodyl (DULCOLAX) suppository 10 mg  10 mg Rectal Daily PRN Eldred Manges, MD      . buPROPion [facility]) 12 hr tablet 150 mg  150 mg Oral BID Eldred Manges, MD   150 mg at 10/18/16 0943  . celecoxib (CELEBREX) capsule 100 mg  100 mg Oral BID Eldred Manges, MD   100 mg at 10/18/16 8469  . cholestyramine light (PREVALITE) packet 4 g  4 g Oral BID Eldred Manges, MD   4 g at 10/17/16 1747  . docusate sodium (COLACE) capsule 100 mg  100 mg Oral BID Eldred Manges, MD   100 mg at 10/17/16 2238  . fluticasone (FLONASE) 50 MCG/ACT nasal spray 1 spray  1 spray Each Nare BID Eldred Manges, MD   1 spray at 10/17/16 2239  . gabapentin (NEURONTIN) capsule 300 mg  300 mg Oral TID Eldred Manges, MD   300 mg at 10/18/16 0943  . methocarbamol (ROBAXIN) tablet 500 mg  500 mg Oral Q6H PRN Eldred Manges, MD   500 mg at 10/18/16 [phone]  Or  . methocarbamol (ROBAXIN) 500 mg in dextrose 5 % 50 mL IVPB  500 mg Intravenous Q6H PRN Eldred Manges, MD      . metoCLOPramide (REGLAN) tablet 5-10 mg  5-10 mg Oral Q8H PRN Eldred Manges, MD      . mirabegron ER [facility]) tablet 25 mg  25 mg Oral Daily Eldred Manges, MD   25 mg at 10/18/16 0093  . ondansetron (ZOFRAN) tablet 4 mg  4 mg Oral Q6H PRN Eldred Manges, MD      . oxyCODONE (Oxy IR/ROXICODONE) immediate release tablet 5-10 mg  5-10 mg Oral Q3H PRN Eldred Manges, MD   10 mg at 10/18/16 0943  . PARoxetine (PAXIL) tablet 20 mg  20 mg Oral Daily Eldred Manges, MD   20 mg at 10/18/16 0943  . polyethylene glycol (MIRALAX / GLYCOLAX) packet 17 g  17 g Oral Daily PRN Eldred Manges, MD      . sodium phosphate (FLEET) 7-19 GM/118ML enema 1 enema  1 enema Rectal Once PRN Eldred Manges, MD      . temazepam (RESTORIL) capsule 30 mg  30 mg Oral QHS Eldred Manges, MD   30 mg at  10/17/16 2238  . Vitamin D (Ergocalciferol) (DRISDOL) capsule 50,000 Units  50,000 Units Oral Q7 days Eldred Manges, MD   50,000 Units at 10/16/16 1044     Discharge Medications: Please see discharge summary for a list of discharge medications.  Relevant Imaging Results:  Relevant Lab Results:   Additional Information SS#:[id number] LCSW

## 2016-10-18 NOTE — Clinical Social Work Placement (Addendum)
   CLINICAL SOCIAL WORK PLACEMENT  NOTE  Date:  10/18/2016  Patient Details  Name: Ariana Patton MRN: [id number] Date of Birth: 06/12/1945  Clinical Social Work is seeking post-discharge placement for this patient at the Skilled  Nursing Facility level of care (*CSW will initial, date and re-position this form in  chart as items are completed):  Yes   Patient/family provided with [place] Clinical Social Work Department's list of facilities offering this level of care within the geographic area requested by the patient (or if unable, by the patient's family).  Yes   Patient/family informed of their freedom to choose among providers that offer the needed level of care, that participate in Medicare, Medicaid or managed care program needed by the patient, have an available bed and are willing to accept the patient.  Yes   Patient/family informed of [place]'s ownership interest in [facility] and [facility], as well as of the fact that they are under no obligation to receive care at these facilities.  PASRR submitted to EDS on       PASRR number received on       Existing PASRR number confirmed on 10/17/16     FL2 transmitted to all facilities in geographic area requested by pt/family on       FL2 transmitted to all facilities within larger geographic area on 10/18/16     Patient informed that his/her managed care company has contracts with or will negotiate with certain facilities, including the following:        Yes   Patient/family informed of bed offers received.  Patient chooses bed at [facility]     Physician recommends and patient chooses bed at      Patient to be transferred to [facility] on 10/18/16.  Patient to be transferred to facility by [facility]     Patient family notified on 10/18/16 of transfer.  Name of family member notified:  Merlene Laughter     PHYSICIAN Please prepare priority discharge summary, including medications, Please sign FL2, Please  prepare prescriptions     Additional Comment:    _______________________________________________ [name], LCSW 10/18/2016, 11:33 AM

## 2016-10-18 NOTE — Progress Notes (Addendum)
Patient will discharge to [facility] Anticipated discharge date: 8/22 Transportation by [facility]- scheduled for 4pm Report #: [phone], if before 5pm let them know you need to speak to RN for room 812A if after 5pm press option for "[facility]"   CSW signing off.  Burna Sis, LCSW Clinical Social Worker [phone]

## 2016-10-18 NOTE — Social Work (Addendum)
CSW received call from [place] at [facility] confirming that they will take patient back when ready.  CSW f/u with Dr. Ophelia Charter indicated that patient is ready to DC.    CSW was advised that Dr.Yates will need to sign prescriptions and will do so in the afternoon.  CSW spoke with son, Ariana Patton about plan to transition back to [facility]. He is in agreement. CSW will call him once transport set up.  Keene Breath, LCSW Clinical Social Worker [phone]

## 2016-10-18 NOTE — Discharge Instructions (Signed)
See Dr. Ophelia Charter in one week for xrays and dressing change. Pin tract care by nurse daily. Elevate leg above heart level.

## 2016-11-07 ENCOUNTER — Ambulatory Visit (INDEPENDENT_AMBULATORY_CARE_PROVIDER_SITE_OTHER): Payer: No Typology Code available for payment source

## 2016-11-07 ENCOUNTER — Ambulatory Visit (INDEPENDENT_AMBULATORY_CARE_PROVIDER_SITE_OTHER): Payer: Medicare Other | Admitting: Orthopaedic Surgery

## 2016-11-07 DIAGNOSIS — M79661 Pain in right lower leg: Secondary | ICD-10-CM | POA: Diagnosis not present

## 2016-11-10 NOTE — Progress Notes (Signed)
   Post-Op Visit Note   Patient: Ariana Patton           Date of Birth: 06/03/1945           MRN: [id number] Visit Date: 11/07/2016 PCP: Patient, No Pcp Per   Assessment & Plan: 71-year-old female returns post application of right leg external fixator for open fracture. Sutures are harvested today. Skin looks good with satisfactory healing. She is at a skilled facility getting her pin track care. She is having some spasms in her thigh and her pin covers up and lost someplace. She has a protective sleeve over opposite leg where she bumps the right lower extremity external fixator against her left calf with scratches. None of these are open.  Chief Complaint:  Chief Complaint  Patient presents with  . Right Leg - Routine Post Op   Visit Diagnoses:  1. Pain in right lower leg     Plan: We'll set patient up for external fixator removal under general anesthesia and fiberglass cast application. Patient had been doing transfers prior to her injury and fall with fracture. Her son is power of attorney and he will after provided informed consent which we have previously discussed at the time of external fixator application and she'll be coming back from removal external fixator cast application. Follow-Up Instructions: No Follow-up on file.   Orders:  Orders Placed This Encounter  Procedures  . XR Tibia/Fibula Right   No orders of the defined types were placed in this encounter.   Imaging: No results found.  PMFS History: Patient Active Problem List   Diagnosis Date Noted  . Open fracture of right tibia and fibula 10/14/2016   Past Medical History:  Diagnosis Date  . Depression   . Multiple sclerosis (HCC)     No family history on file.  Past Surgical History:  Procedure Laterality Date  . EXTERNAL FIXATION LEG Right 10/13/2016   Procedure: Application of external fixator Right lower leg;  Surgeon: Taje Littler C, MD;  Location: [facility] OR;  Service: Orthopedics;  Laterality: Right;    Social History   Occupational History  . Not on file.   Social History Main Topics  . Smoking status: Not on file  . Smokeless tobacco: Not on file  . Alcohol use Not on file  . Drug use: Unknown  . Sexual activity: Not on file    

## 2016-11-14 ENCOUNTER — Ambulatory Visit (INDEPENDENT_AMBULATORY_CARE_PROVIDER_SITE_OTHER): Payer: Self-pay

## 2016-11-14 ENCOUNTER — Ambulatory Visit (INDEPENDENT_AMBULATORY_CARE_PROVIDER_SITE_OTHER): Payer: Medicare Other | Admitting: Orthopaedic Surgery

## 2016-11-14 DIAGNOSIS — M79661 Pain in right lower leg: Secondary | ICD-10-CM

## 2016-11-15 ENCOUNTER — Ambulatory Visit (HOSPITAL_COMMUNITY)
Admission: RE | Admit: 2016-11-15 | Discharge: 2016-11-15 | Disposition: A | Discharge status: Skilled Nursing Facility | Payer: Medicare Other | Source: Ambulatory Visit | Attending: Orthopaedic Surgery | Admitting: Orthopaedic Surgery

## 2016-11-15 ENCOUNTER — Encounter (HOSPITAL_COMMUNITY): Payer: Self-pay | Admitting: *Deleted

## 2016-11-15 ENCOUNTER — Ambulatory Visit (HOSPITAL_COMMUNITY): Payer: Medicare Other

## 2016-11-15 ENCOUNTER — Other Ambulatory Visit (INDEPENDENT_AMBULATORY_CARE_PROVIDER_SITE_OTHER): Payer: Self-pay | Admitting: Orthopaedic Surgery

## 2016-11-15 ENCOUNTER — Ambulatory Visit (HOSPITAL_COMMUNITY): Payer: Medicare Other | Admitting: Anesthesiology

## 2016-11-15 ENCOUNTER — Encounter (HOSPITAL_COMMUNITY)
Admission: RE | Disposition: A | Discharge status: Skilled Nursing Facility | Payer: Self-pay | Source: Ambulatory Visit | Attending: Orthopaedic Surgery

## 2016-11-15 DIAGNOSIS — S82401A Unspecified fracture of shaft of right fibula, initial encounter for closed fracture: Secondary | ICD-10-CM

## 2016-11-15 DIAGNOSIS — S82831D Other fracture of upper and lower end of right fibula, subsequent encounter for closed fracture with routine healing: Secondary | ICD-10-CM | POA: Diagnosis not present

## 2016-11-15 DIAGNOSIS — F329 Major depressive disorder, single episode, unspecified: Secondary | ICD-10-CM | POA: Diagnosis not present

## 2016-11-15 DIAGNOSIS — S82301A Unspecified fracture of lower end of right tibia, initial encounter for closed fracture: Secondary | ICD-10-CM | POA: Insufficient documentation

## 2016-11-15 DIAGNOSIS — S82201A Unspecified fracture of shaft of right tibia, initial encounter for closed fracture: Secondary | ICD-10-CM

## 2016-11-15 DIAGNOSIS — Z419 Encounter for procedure for purposes other than remedying health state, unspecified: Secondary | ICD-10-CM

## 2016-11-15 DIAGNOSIS — S82251E Displaced comminuted fracture of shaft of right tibia, subsequent encounter for open fracture type I or II with routine healing: Secondary | ICD-10-CM | POA: Diagnosis not present

## 2016-11-15 DIAGNOSIS — G35 Multiple sclerosis: Secondary | ICD-10-CM | POA: Insufficient documentation

## 2016-11-15 DIAGNOSIS — Z7982 Long term (current) use of aspirin: Secondary | ICD-10-CM | POA: Insufficient documentation

## 2016-11-15 DIAGNOSIS — W19XXXD Unspecified fall, subsequent encounter: Secondary | ICD-10-CM | POA: Diagnosis not present

## 2016-11-15 HISTORY — PX: CAST APPLICATION: SHX380

## 2016-11-15 HISTORY — PX: EXTERNAL FIXATION REMOVAL: SHX5040

## 2016-11-15 LAB — SURGICAL PCR SCREEN
MRSA, PCR: NEGATIVE
STAPHYLOCOCCUS AUREUS: NEGATIVE

## 2016-11-15 LAB — BASIC METABOLIC PANEL
ANION GAP: 11 (ref 5–15)
BUN: 10 mg/dL (ref 6–20)
CALCIUM: 9.2 mg/dL (ref 8.9–10.3)
CHLORIDE: 99 mmol/L — AB (ref 101–111)
CO2: 22 mmol/L (ref 22–32)
Creatinine, Ser: 0.86 mg/dL (ref 0.44–1.00)
GFR calc non Af Amer: >60 mL/min (ref >60)
GLUCOSE: 86 mg/dL (ref 65–99)
POTASSIUM: 4.5 mmol/L (ref 3.5–5.1)
Sodium: 132 mmol/L — ABNORMAL LOW (ref 135–145)

## 2016-11-15 LAB — CBC
HEMATOCRIT: 32.6 % — AB (ref 36.0–46.0)
HEMOGLOBIN: 10.6 g/dL — AB (ref 12.0–15.0)
MCH: 29.5 pg (ref 26.0–34.0)
MCHC: 32.5 g/dL (ref 30.0–36.0)
MCV: 90.8 fL (ref 78.0–100.0)
Platelets: 252 10*3/uL (ref 150–400)
RBC: 3.59 MIL/uL — ABNORMAL LOW (ref 3.87–5.11)
RDW: 13.1 % (ref 11.5–15.5)
WBC: 7 10*3/uL (ref 4.0–10.5)

## 2016-11-15 SURGERY — REMOVAL, EXTERNAL FIXATION DEVICE, LOWER EXTREMITY
Anesthesia: General | Site: Leg Lower | Laterality: Right

## 2016-11-15 MED ORDER — MIDAZOLAM HCL 5 MG/5ML IJ SOLN
INTRAMUSCULAR | Status: DC | PRN
  Administered 2016-11-15: 2 mg via INTRAVENOUS

## 2016-11-15 MED ORDER — LIDOCAINE 2% (20 MG/ML) 5 ML SYRINGE
INTRAMUSCULAR | Status: AC
  Filled 2016-11-15: qty 5

## 2016-11-15 MED ORDER — PHENYLEPHRINE 40 MCG/ML (10ML) SYRINGE FOR IV PUSH (FOR BLOOD PRESSURE SUPPORT)
PREFILLED_SYRINGE | INTRAVENOUS | Status: AC
  Filled 2016-11-15: qty 20

## 2016-11-15 MED ORDER — LIDOCAINE 2% (20 MG/ML) 5 ML SYRINGE
INTRAMUSCULAR | Status: DC | PRN
  Administered 2016-11-15: 40 mg via INTRAVENOUS

## 2016-11-15 MED ORDER — PROPOFOL 10 MG/ML IV BOLUS
INTRAVENOUS | Status: AC
  Filled 2016-11-15: qty 20

## 2016-11-15 MED ORDER — CHLORHEXIDINE GLUCONATE 4 % EX LIQD: 60.0000 mL | Freq: Once | CUTANEOUS | Status: DC

## 2016-11-15 MED ORDER — ONDANSETRON HCL 4 MG/2ML IJ SOLN
INTRAMUSCULAR | Status: DC | PRN
  Administered 2016-11-15: 4 mg via INTRAVENOUS

## 2016-11-15 MED ORDER — LACTATED RINGERS IV SOLN
INTRAVENOUS | Status: DC | PRN
  Administered 2016-11-15: 15:00:00 via INTRAVENOUS

## 2016-11-15 MED ORDER — CEFAZOLIN SODIUM-DEXTROSE 2-4 GM/100ML-% IV SOLN: 2.0000 g | INTRAVENOUS | Status: DC

## 2016-11-15 MED ORDER — MIDAZOLAM HCL 2 MG/2ML IJ SOLN
INTRAMUSCULAR | Status: AC
  Filled 2016-11-15: qty 2

## 2016-11-15 MED ORDER — DEXAMETHASONE SODIUM PHOSPHATE 10 MG/ML IJ SOLN
INTRAMUSCULAR | Status: DC | PRN
  Administered 2016-11-15: 5 mg via INTRAVENOUS

## 2016-11-15 MED ORDER — CEFAZOLIN SODIUM-DEXTROSE 2-4 GM/100ML-% IV SOLN
INTRAVENOUS | Status: AC
  Filled 2016-11-15: qty 100

## 2016-11-15 MED ORDER — POVIDONE-IODINE 10 % EX SWAB: 2.0000 "application " | Freq: Once | CUTANEOUS | Status: DC

## 2016-11-15 MED ORDER — PHENYLEPHRINE 40 MCG/ML (10ML) SYRINGE FOR IV PUSH (FOR BLOOD PRESSURE SUPPORT)
PREFILLED_SYRINGE | INTRAVENOUS | Status: DC | PRN
  Administered 2016-11-15: 40 ug via INTRAVENOUS
  Administered 2016-11-15 (×3): 80 ug via INTRAVENOUS
  Administered 2016-11-15: 120 ug via INTRAVENOUS

## 2016-11-15 MED ORDER — PROPOFOL 10 MG/ML IV BOLUS
INTRAVENOUS | Status: DC | PRN
  Administered 2016-11-15: 110 mg via INTRAVENOUS

## 2016-11-15 MED ORDER — FENTANYL CITRATE (PF) 100 MCG/2ML IJ SOLN
INTRAMUSCULAR | Status: DC | PRN
  Administered 2016-11-15: 50 ug via INTRAVENOUS

## 2016-11-15 MED ORDER — FENTANYL CITRATE (PF) 250 MCG/5ML IJ SOLN
INTRAMUSCULAR | Status: AC
  Filled 2016-11-15: qty 5

## 2016-11-15 MED ORDER — CEFAZOLIN SODIUM-DEXTROSE 2-4 GM/100ML-% IV SOLN
2.0000 g | INTRAVENOUS | Status: AC
  Administered 2016-11-15: 2 g via INTRAVENOUS

## 2016-11-15 MED ORDER — DEXAMETHASONE SODIUM PHOSPHATE 10 MG/ML IJ SOLN
INTRAMUSCULAR | Status: AC
  Filled 2016-11-15: qty 1

## 2016-11-15 MED ORDER — FENTANYL CITRATE (PF) 100 MCG/2ML IJ SOLN: 25.0000 ug | INTRAMUSCULAR | Status: DC | PRN

## 2016-11-15 SURGICAL SUPPLY — 41 items
BANDAGE ACE 4X5 VEL STRL LF (GAUZE/BANDAGES/DRESSINGS) ×1 IMPLANT
BANDAGE ACE 6X5 VEL STRL LF (GAUZE/BANDAGES/DRESSINGS) ×1 IMPLANT
BANDAGE ESMARK 6X9 LF (GAUZE/BANDAGES/DRESSINGS) IMPLANT
BNDG CMPR 9X6 STRL LF SNTH (GAUZE/BANDAGES/DRESSINGS)
BNDG COHESIVE 6X5 TAN STRL LF (GAUZE/BANDAGES/DRESSINGS) ×1 IMPLANT
BNDG ESMARK 6X9 LF (GAUZE/BANDAGES/DRESSINGS)
BNDG GAUZE ELAST 4 BULKY (GAUZE/BANDAGES/DRESSINGS) ×2 IMPLANT
COVER SURGICAL LIGHT HANDLE (MISCELLANEOUS) ×1 IMPLANT
DRAPE C-ARM 42X72 X-RAY (DRAPES) IMPLANT
DRAPE U-SHAPE 47X51 STRL (DRAPES) ×1 IMPLANT
DRSG ADAPTIC 3X8 NADH LF (GAUZE/BANDAGES/DRESSINGS) ×1 IMPLANT
ELECT REM PT RETURN 9FT ADLT (ELECTROSURGICAL)
ELECTRODE REM PT RTRN 9FT ADLT (ELECTROSURGICAL) ×1 IMPLANT
GAUZE SPONGE 4X4 12PLY STRL (GAUZE/BANDAGES/DRESSINGS) ×5 IMPLANT
GLOVE BIOGEL PI IND STRL 8 (GLOVE) ×2 IMPLANT
GLOVE BIOGEL PI INDICATOR 8 (GLOVE)
GLOVE ORTHO TXT STRL SZ7.5 (GLOVE) ×2 IMPLANT
GOWN STRL REUS W/ TWL LRG LVL3 (GOWN DISPOSABLE) ×1 IMPLANT
GOWN STRL REUS W/ TWL XL LVL3 (GOWN DISPOSABLE) ×1 IMPLANT
GOWN STRL REUS W/TWL 2XL LVL3 (GOWN DISPOSABLE) ×1 IMPLANT
GOWN STRL REUS W/TWL LRG LVL3 (GOWN DISPOSABLE)
GOWN STRL REUS W/TWL XL LVL3 (GOWN DISPOSABLE)
KIT BASIN OR (CUSTOM PROCEDURE TRAY) ×3 IMPLANT
KIT ROOM TURNOVER OR (KITS) ×1 IMPLANT
MANIFOLD NEPTUNE II (INSTRUMENTS) ×1 IMPLANT
NS IRRIG 1000ML POUR BTL (IV SOLUTION) ×1 IMPLANT
PACK ORTHO EXTREMITY (CUSTOM PROCEDURE TRAY) ×1 IMPLANT
PAD ARMBOARD 7.5X6 YLW CONV (MISCELLANEOUS) ×2 IMPLANT
PADDING CAST COTTON 6X4 STRL (CAST SUPPLIES) ×3 IMPLANT
PENCIL BUTTON HOLSTER BLD 10FT (ELECTRODE) IMPLANT
SPONGE LAP 18X18 X RAY DECT (DISPOSABLE) ×3 IMPLANT
STOCKINETTE IMPERVIOUS LG (DRAPES) ×3 IMPLANT
SUT ETHILON 3 0 PS 1 (SUTURE) IMPLANT
SUT VIC AB 0 CT1 27 (SUTURE)
SUT VIC AB 0 CT1 27XBRD ANBCTR (SUTURE) IMPLANT
SUT VIC AB 2-0 CT1 27 (SUTURE)
SUT VIC AB 2-0 CT1 TAPERPNT 27 (SUTURE) IMPLANT
TOWEL OR 17X24 6PK STRL BLUE (TOWEL DISPOSABLE) ×2 IMPLANT
TOWEL OR 17X26 10 PK STRL BLUE (TOWEL DISPOSABLE) ×3 IMPLANT
UNDERPAD 30X30 (UNDERPADS AND DIAPERS) ×3 IMPLANT
WATER STERILE IRR 1000ML POUR (IV SOLUTION) ×2 IMPLANT

## 2016-11-15 NOTE — Interval H&P Note (Signed)
History and Physical Interval Note:  11/15/2016 2:52 PM  Ariana Patton  has presented today for surgery, with the diagnosis of Right tibia/fibula fracture with external fixator  The various methods of treatment have been discussed with the patient and family. After consideration of risks, benefits and other options for treatment, the patient has consented to  Procedure(s): REMOVAL EXTERNAL FIXATION RIGHT TIBIA/FIBULA (Right) FIBERGLASS CAST APPLICATION RIGHT TIBIA/FIBULA FRACTURE (Right) as a surgical intervention .  The patient's history has been reviewed, patient examined, no change in status, stable for surgery.  I have reviewed the patient's chart and labs.  Questions were answered to the patient's satisfaction.     Eldred Manges

## 2016-11-15 NOTE — Anesthesia Postprocedure Evaluation (Signed)
Anesthesia Post Note  Patient: Ariana Patton  Procedure(s) Performed: Procedure(s) (LRB): REMOVAL EXTERNAL FIXATION RIGHT TIBIA/FIBULA (Right) FIBERGLASS CAST APPLICATION RIGHT TIBIA/FIBULA FRACTURE (Right)     Patient location during evaluation: PACU Anesthesia Type: General Level of consciousness: awake and alert Pain management: pain level controlled Vital Signs Assessment: post-procedure vital signs reviewed and stable Respiratory status: spontaneous breathing, nonlabored ventilation and respiratory function stable Cardiovascular status: blood pressure returned to baseline and stable Postop Assessment: no apparent nausea or vomiting Anesthetic complications: no    Last Vitals:  Vitals:   11/15/16 2000 11/15/16 2007  BP:  (!) 116/50  Pulse:    Resp:    Temp: 36.6 C   SpO2:      Last Pain:  Vitals:   11/15/16 2000  TempSrc:   PainSc: 0-No pain                 Avinash Maltos,W. EDMOND

## 2016-11-15 NOTE — Anesthesia Procedure Notes (Signed)
Procedure Name: Intubation Date/Time: 11/15/2016 3:24 PM Performed by: Melina Copa, Alyn Jurney R Pre-anesthesia Checklist: Patient identified, Emergency Drugs available, Suction available and Patient being monitored Patient Re-evaluated:Patient Re-evaluated prior to induction Oxygen Delivery Method: Circle System Utilized Preoxygenation: Pre-oxygenation with 100% oxygen Induction Type: IV induction Ventilation: Mask ventilation without difficulty Laryngoscope Size: Mac and 3 Grade View: Grade II Tube type: Oral Tube size: 7.5 mm Number of attempts: 1 Airway Equipment and Method: Stylet Placement Confirmation: ETT inserted through vocal cords under direct vision,  positive ETCO2 and breath sounds checked- equal and bilateral Secured at: 20 cm Tube secured with: Tape Dental Injury: Teeth and Oropharynx as per pre-operative assessment

## 2016-11-15 NOTE — Brief Op Note (Signed)
11/15/2016  3:58 PM  PATIENT:  Ariana Patton  71 y.o. female  PRE-OPERATIVE DIAGNOSIS:  Right tibia/fibula fracture with external fixator  POST-OPERATIVE DIAGNOSIS:  Right tibia/fibula fracture with external fixator  PROCEDURE:  Procedure(s): REMOVAL EXTERNAL FIXATION RIGHT TIBIA/FIBULA (Right) FIBERGLASS CAST APPLICATION RIGHT TIBIA/FIBULA FRACTURE (Right)  SURGEON:  Surgeon(s) and Role:    * Eldred Manges, MD - Primary  PHYSICIAN ASSISTANT:   ASSISTANTS: none   ANESTHESIA:   general  EBL:  Total I/O In: 250 [I.V.:250] Out: -   BLOOD ADMINISTERED:none  DRAINS: none   LOCAL MEDICATIONS USED:  NONE  SPECIMEN:  No Specimen  DISPOSITION OF SPECIMEN:  N/A  COUNTS:  YES  TOURNIQUET:    DICTATION: .Dragon Dictation  PLAN OF CARE: discharge back to SNF after PACU  PATIENT DISPOSITION:  PACU - hemodynamically stable.   Delay start of Pharmacological VTE agent (>24hrs) due to surgical blood loss or risk of bleeding:

## 2016-11-15 NOTE — H&P (View-Only) (Signed)
   Post-Op Visit Note   Patient: Ariana Patton           Date of Birth: 01-06-1946           MRN: [id number] Visit Date: 11/07/2016 PCP: Patient, No Pcp Per   Assessment & Plan: 71 year old female returns post application of right leg external fixator for open fracture. Sutures are harvested today. Skin looks good with satisfactory healing. She is at a skilled facility getting her pin track care. She is having some spasms in her thigh and her pin covers up and lost someplace. She has a protective sleeve over opposite leg where she bumps the right lower extremity external fixator against her left calf with scratches. None of these are open.  Chief Complaint:  Chief Complaint  Patient presents with  . Right Leg - Routine Post Op   Visit Diagnoses:  1. Pain in right lower leg     Plan: We'll set patient up for external fixator removal under general anesthesia and fiberglass cast application. Patient had been doing transfers prior to her injury and fall with fracture. Her son is power of attorney and he will after provided informed consent which we have previously discussed at the time of external fixator application and she'll be coming back from removal external fixator cast application. Follow-Up Instructions: No Follow-up on file.   Orders:  Orders Placed This Encounter  Procedures  . XR Tibia/Fibula Right   No orders of the defined types were placed in this encounter.   Imaging: No results found.  PMFS History: Patient Active Problem List   Diagnosis Date Noted  . Open fracture of right tibia and fibula 10/14/2016   Past Medical History:  Diagnosis Date  . Depression   . Multiple sclerosis (HCC)     No family history on file.  Past Surgical History:  Procedure Laterality Date  . EXTERNAL FIXATION LEG Right 10/13/2016   Procedure: Application of external fixator Right lower leg;  Surgeon: Eldred Manges, MD;  Location: [facility] OR;  Service: Orthopedics;  Laterality: Right;    Social History   Occupational History  . Not on file.   Social History Main Topics  . Smoking status: Not on file  . Smokeless tobacco: Not on file  . Alcohol use Not on file  . Drug use: Unknown  . Sexual activity: Not on file

## 2016-11-15 NOTE — Progress Notes (Addendum)
I discussed with Ms. Ariana Patton, as to where her smartphone would go when she goes to surgery.  I placed her smartphone in one of our yellow booties, placed the booty in the belongings bag.  It will be taken to PACU when she goes to OR.  She verbalizes that she understands and is in agreement to with her phone will go.   Ariana Patton

## 2016-11-15 NOTE — Discharge Instructions (Signed)
Elevate foot when in bed. Office one week to see Dr. Ophelia Charter . Still non weight bearing right leg.

## 2016-11-15 NOTE — Anesthesia Preprocedure Evaluation (Addendum)
Anesthesia Evaluation  Patient identified by MRN, date of birth, ID band Patient awake    Reviewed: Allergy & Precautions, H&P , NPO status , Patient's Chart, lab work & pertinent test results  Airway Mallampati: II  TM Distance: >3 FB Neck ROM: Full    Dental no notable dental hx. (+) Partial Upper, Edentulous Lower, Dental Advisory Given   Pulmonary neg pulmonary ROS,    Pulmonary exam normal breath sounds clear to auscultation       Cardiovascular negative cardio ROS   Rhythm:Regular Rate:Normal     Neuro/Psych Depression MS  Neuromuscular disease    GI/Hepatic negative GI ROS, Neg liver ROS,   Endo/Other  negative endocrine ROS  Renal/GU negative Renal ROS  negative genitourinary   Musculoskeletal   Abdominal   Peds  Hematology negative hematology ROS (+)   Anesthesia Other Findings   Reproductive/Obstetrics negative OB ROS                            Anesthesia Physical Anesthesia Plan  ASA: II  Anesthesia Plan: General   Post-op Pain Management:    Induction: Intravenous  PONV Risk Score and Plan: 3 and Ondansetron, Dexamethasone and Midazolam  Airway Management Planned: LMA  Additional Equipment:   Intra-op Plan:   Post-operative Plan: Extubation in OR  Informed Consent: I have reviewed the patients History and Physical, chart, labs and discussed the procedure including the risks, benefits and alternatives for the proposed anesthesia with the patient or authorized representative who has indicated his/her understanding and acceptance.   Dental advisory given  Plan Discussed with: CRNA  Anesthesia Plan Comments:        Anesthesia Quick Evaluation

## 2016-11-15 NOTE — Discharge Summary (Signed)
Patient came from SNF and had external fixator removed from right leg and application of right leg cast . She will be transported back to SNF and resume all previous medications. Still NWB on right leg. Office follow-up with Dr. Ophelia Charter in one week. Office # [phone] for appointment time. Keep foot elevated above heart level while in bed.

## 2016-11-15 NOTE — H&P (Signed)
Ariana Patton is an 71 y.o. female.   Chief Complaint: tib fib Fx with loose external fixator  HPI: 71 yo female seen yesterday in office post Ex fix for open tib fib right with previous TKA and lateral femoral plate, falling Hx. 2 days ago calcaneal pin shifted and threads are out of the skin. Brought in from SNF for ext fix removal and casting.   Past Medical History:  Diagnosis Date  . Depression   . Multiple sclerosis ([place])     Past Surgical History:  Procedure Laterality Date  . CHOLECYSTECTOMY    . EXTERNAL FIXATION LEG Right 10/13/2016   Procedure: Application of external fixator Right lower leg;  Surgeon: Marybelle Killings, MD;  Location: [place];  Service: Orthopedics;  Laterality: Right;    History reviewed. No pertinent family history. Social History:  reports that she has never smoked. She has never used smokeless tobacco. She reports that she does not drink alcohol or use drugs.  Allergies: No Known Allergies  Medications Prior to Admission  Medication Sig Dispense Refill  . aspirin EC 325 MG tablet Take 1 tablet (325 mg total) by mouth daily. 30 tablet 0  . buPROPion (WELLBUTRIN SR) 150 MG 12 hr tablet Take 150 mg by mouth 2 (two) times daily.    . celecoxib (CELEBREX) 100 MG capsule Take 100 mg by mouth 2 (two) times daily.    . cholestyramine light (PREVALITE) 4 g packet Take 4 g by mouth 2 (two) times daily.    . fluticasone (FLONASE) 50 MCG/ACT nasal spray Place 1 spray into both nostrils 2 (two) times daily.    Marland Kitchen gabapentin (NEURONTIN) 300 MG capsule Take 300 mg by mouth 3 (three) times daily.    Marland Kitchen HYDROcodone-acetaminophen (NORCO) 5-325 MG tablet Take 1 tablet by mouth every 6 (six) hours as needed for moderate pain. 30 tablet 0  . mirabegron ER (MYRBETRIQ) 25 MG TB24 tablet Take 25 mg by mouth daily.    Marland Kitchen PARoxetine (PAXIL) 20 MG tablet Take 20 mg by mouth daily.    . temazepam (RESTORIL) 30 MG capsule Take 30 mg by mouth at bedtime.    . Vitamin D, Ergocalciferol,  (DRISDOL) 50000 units CAPS capsule Take 50,000 Units by mouth every 7 (seven) days.      Results for orders placed or performed during the hospital encounter of 11/15/16 (from the past 48 hour(s))  CBC     Status: Abnormal   Collection Time: 11/15/16 12:37 PM  Result Value Ref Range   WBC 7.0 4.0 - 10.5 K/uL   RBC 3.59 (L) 3.87 - 5.11 MIL/uL   Hemoglobin 10.6 (L) 12.0 - 15.0 g/dL   HCT 32.6 (L) 36.0 - 46.0 %   MCV 90.8 78.0 - 100.0 fL   MCH 29.5 26.0 - 34.0 pg   MCHC 32.5 30.0 - 36.0 g/dL   RDW 13.1 11.5 - 15.5 %   Platelets 252 150 - 400 K/uL  Basic metabolic panel     Status: Abnormal   Collection Time: 11/15/16 12:37 PM  Result Value Ref Range   Sodium 132 (L) 135 - 145 mmol/L   Potassium 4.5 3.5 - 5.1 mmol/L   Chloride 99 (L) 101 - 111 mmol/L   CO2 22 22 - 32 mmol/L   Glucose, Bld 86 65 - 99 mg/dL   BUN 10 6 - 20 mg/dL   Creatinine, Ser 0.86 0.44 - 1.00 mg/dL   Calcium 9.2 8.9 - 10.3 mg/dL   GFR  calc non Af Amer >60 >60 mL/min   GFR calc Af Amer >60 >60 mL/min    Comment: (NOTE) The eGFR has been calculated using the CKD EPI equation. This calculation has not been validated in all clinical situations. eGFR's persistently <60 mL/min signify possible Chronic Kidney Disease.    Anion gap 11 5 - 15  Surgical pcr screen     Status: None   Collection Time: 11/15/16 12:46 PM  Result Value Ref Range   MRSA, PCR NEGATIVE NEGATIVE   Staphylococcus aureus NEGATIVE NEGATIVE    Comment: (NOTE) The Xpert SA Assay (FDA approved for NASAL specimens in patients 16 years of age and older), is one component of a comprehensive surveillance program. It is not intended to diagnose infection nor to guide or monitor treatment.    No results found.  Review of Systems  HENT: Negative.   Musculoskeletal: Positive for falls.  Skin: Negative.   Neurological: Positive for weakness.  Psychiatric/Behavioral: Positive for memory loss.    Blood pressure 100/65, pulse 77, temperature 97.6  F (36.4 C), temperature source Oral, resp. rate 18, height '5\' 2"'$  (1.575 m), weight 137 lb (62.1 kg), SpO2 98 %. Physical Exam  Constitutional: She is oriented to person, place, and time. She appears well-developed.  Thin elderly.   HENT:  Head: Normocephalic.  Eyes: Pupils are equal, round, and reactive to light.  Neck: Normal range of motion.  Cardiovascular: Normal rate.   Respiratory: Effort normal.  GI: Soft.  Musculoskeletal:  Calcaneal pin threads out of the skin with loosening . Erythema of pin tracts calcaneus.  Neurological: She is alert and oriented to person, place, and time.  Skin: Skin is warm.  Psychiatric: She has a normal mood and affect.   skin with eschar from fx blisters . Previous open distal tib open fx incision /laceration is healed.   Assessment/Plan Removal ext fix cast application. Has TKA which prevents normal nail insertion site .  Marybelle Killings, MD 11/15/2016, 2:52 PM

## 2016-11-15 NOTE — Transfer of Care (Signed)
Immediate Anesthesia Transfer of Care Note  Patient: Ariana Patton  Procedure(s) Performed: Procedure(s): REMOVAL EXTERNAL FIXATION RIGHT TIBIA/FIBULA (Right) FIBERGLASS CAST APPLICATION RIGHT TIBIA/FIBULA FRACTURE (Right)  Patient Location: PACU  Anesthesia Type:General  Level of Consciousness: awake, oriented and patient cooperative  Airway & Oxygen Therapy: Patient Spontanous Breathing and Patient connected to nasal cannula oxygen  Post-op Assessment: Report given to RN, Post -op Vital signs reviewed and stable and Patient moving all extremities  Post vital signs: Reviewed and stable  Last Vitals:  Vitals:   11/15/16 1226  BP: 100/65  Pulse: 77  Resp: 18  Temp: 36.4 C  SpO2: 98%    Last Pain:  Vitals:   11/15/16 1226  TempSrc: Oral      Patients Stated Pain Goal: 2 (11/15/16 1226)  Complications: No apparent anesthesia complications

## 2016-11-15 NOTE — Progress Notes (Addendum)
Dime size Red area noted to left buttock. Dr Sampson Goon informed pt ate bowl of cornflakes and coffee with cream at 0930.

## 2016-11-16 ENCOUNTER — Encounter (HOSPITAL_COMMUNITY): Payer: Self-pay | Admitting: Orthopaedic Surgery

## 2016-11-16 NOTE — Op Note (Signed)
There is a 1 day delay in dictating this operative note due to dragon dictation going down due to a citrix update.  Preop diagnosis: Previous right open tib-fib fracture with loose external fixator pin in the calcaneus.  Postop diagnosis: Same  Procedure: Removal of external fixator, fiberglass cast application.  Procedure patient is one month out from open tib-fib fracture with splint considerable swelling and medial skin blisters after a fall. She had previous total knee arthroplasty preventing intramedullary rod fixation and considerable swelling with fracture blisters and open fracture incision preventing early plate fixation of the distal tibia fracture. She been managed and external fixator and presented with loosening of the calcaneal pin with the threaded portion of the pin sliding out of the calcaneus in her foot sitting laterally on the pin adjacent to the external fixator. Overall alignment of her fracture had not changed.  After induction of general anesthesia with LMA tube placement meticulous scrubbing the pin sites were performed. External fixator was cut with a large pin cutter All fixator was removed. Pin tracks meticulously scrubbed once again. Short-leg fiberglass cast was applied under fluoroscopy. The fracture was sticky had minimal motion and there was slight angulation and slight displacement. Prior to her injury she was a minimal skilled nursing facility ambulator who transferred to a wheelchair after total knee arthroplasty and then distal femoral fracture with severe osteoporosis. Spot fluoroscopic pictures were taken after application showing unchanged position and alignment. Office follow-up will be in 1-2 weeks for cast removal with checking her pin tracks and then it re-application of a cast to make sure calcaneal pin track sites look good. Patient tolerated the procedure well was transferred back to the skilled nursing facility.

## 2016-11-20 NOTE — Progress Notes (Signed)
   Post-Op Visit Note   Patient: Ariana Patton           Date of Birth: 11/25/1945           MRN: [id number] Visit Date: 11/14/2016 PCP: Patient, No Pcp Per   Assessment & Plan: 71 year old female who was a minimal ambulator basically transferring a skilled facility when she fell had a tib-fib fracture which was open and was placed in an external fixator. She returns due to the external fixator pin in the calcaneus shifting and now her heel is laterally placed with the threadd pin sticking out through the skin.  Open site from her fracture is healed without cellulitis. She did have medial fracture blisters over the distal tibia. Proximal tibial pin sites look good. We will schedule admission shortly for removal external fixator scrubbing of the pin sites and application of a fiberglass cast. We discussed with her power of attorney child previously that if the cast did not allow healing that she may require surgery. She's had total knee arthroplasty performed previously and also had distal femur periprosthetic fracture treated with a plate.  Chief Complaint:  Chief Complaint  Patient presents with  . Right Leg - Follow-up, Pain   Visit Diagnoses:  1. Pain in right lower leg     Plan: Removal of external fixator under anesthesia and short-leg cast application.  Follow-Up Instructions: No Follow-up on file.   Orders:  Orders Placed This Encounter  Procedures  . XR Tibia/Fibula Right   No orders of the defined types were placed in this encounter.   Imaging: No results found.  PMFS History: Patient Active Problem List   Diagnosis Date Noted  . Open fracture of right tibia and fibula 10/14/2016   Past Medical History:  Diagnosis Date  . Depression   . Multiple sclerosis ([facility])     No family history on file.  Past Surgical History:  Procedure Laterality Date  . CAST APPLICATION Right 11/15/2016   Procedure: FIBERGLASS CAST APPLICATION RIGHT TIBIA/FIBULA FRACTURE;  Surgeon:  Eldred Manges, MD;  Location: [facility] OR;  Service: Orthopedics;  Laterality: Right;  . CHOLECYSTECTOMY    . EXTERNAL FIXATION LEG Right 10/13/2016   Procedure: Application of external fixator Right lower leg;  Surgeon: Eldred Manges, MD;  Location[facility] OR;  Service: Orthopedics;  Laterality: Right;  . EXTERNAL FIXATION REMOVAL Right 11/15/2016   Procedure: REMOVAL EXTERNAL FIXATION RIGHT TIBIA/FIBULA;  Surgeon: Eldred Manges, MD;  Location: [facility] OR;  Service: Orthopedics;  Laterality: Right;   Social History   Occupational History  . Not on file.   Social History Main Topics  . Smoking status: Never Smoker  . Smokeless tobacco: Never Used  . Alcohol use No  . Drug use: No  . Sexual activity: Not on file

## 2016-12-08 ENCOUNTER — Ambulatory Visit (INDEPENDENT_AMBULATORY_CARE_PROVIDER_SITE_OTHER): Payer: Medicare Other | Admitting: Orthopaedic Surgery

## 2016-12-08 ENCOUNTER — Ambulatory Visit (INDEPENDENT_AMBULATORY_CARE_PROVIDER_SITE_OTHER): Payer: Medicare Other

## 2016-12-08 VITALS — BP 108/74 | HR 88

## 2016-12-08 DIAGNOSIS — M79661 Pain in right lower leg: Secondary | ICD-10-CM

## 2016-12-08 DIAGNOSIS — S82401E Unspecified fracture of shaft of right fibula, subsequent encounter for open fracture type I or II with routine healing: Secondary | ICD-10-CM

## 2016-12-08 DIAGNOSIS — S82201E Unspecified fracture of shaft of right tibia, subsequent encounter for open fracture type I or II with routine healing: Secondary | ICD-10-CM

## 2016-12-08 NOTE — Progress Notes (Signed)
   Post-Op Visit Note   Patient: Ariana Patton           Date of Birth: [birth year]           MRN: [id number] Visit Date: 12/08/2016 PCP: Patient, No Pcp Per   Assessment & Plan: Return in 3 weeks for cast off x-rays out of cast and then either repeat casting versus CAM boot.  Chief Complaint:  Chief Complaint  Patient presents with  . Right Leg - Routine Post Op   Visit Diagnoses:  1. Pain in right lower leg     Plan: Return in 3 weeks for cast off x-rays out of cast exam and then recasting versus CAM boot. Patient has MS and has significant involvement of her left lower extremity which is her weak leg. She's had total knee arthroplasty on the right and then periprosthetic femur fracture treated with a lateral femoral plate treated elsewhere. She then felt the open tibia fracture which is in cast treatment after removal of external fixator. Return 3 weeks for cast off x-rays out of cast and then possible recasting.   llow-Up Instructions: Return in about 3 weeks (around 12/29/2016).   Orders:  Orders Placed This Encounter  Procedures  . XR Tibia/Fibula Right   No orders of the defined types were placed in this encounter.   Imaging: No results found.  PMFS History: Patient Active Problem List   Diagnosis Date Noted  . Open fracture of right tibia and fibula 10/14/2016   Past Medical History:  Diagnosis Date  . Depression   . Multiple sclerosis ([facility])     No family history on file.  Past Surgical History:  Procedure Laterality Date  . CAST APPLICATION Right 11/15/2016   Procedure: FIBERGLASS CAST APPLICATION RIGHT TIBIA/FIBULA FRACTURE;  Surgeon: Eldred Manges, MD;  Location: [facility] OR;  Service: Orthopedics;  Laterality: Right;  . CHOLECYSTECTOMY    . EXTERNAL FIXATION LEG Right 10/13/2016   Procedure: Application of external fixator Right lower leg;  Surgeon: Eldred Manges, MD;  Location: [facility] OR;  Service: Orthopedics;  Laterality: Right;  . EXTERNAL FIXATION REMOVAL Right  11/15/2016   Procedure: REMOVAL EXTERNAL FIXATION RIGHT TIBIA/FIBULA;  Surgeon: Eldred Manges, MD;  Location: [facility] OR;  Service: Orthopedics;  Laterality: Right;   Social History   Occupational History  . Not on file.   Social History Main Topics  . Smoking status: Never Smoker  . Smokeless tobacco: Never Used  . Alcohol use No  . Drug use: No  . Sexual activity: Not on file

## 2016-12-29 ENCOUNTER — Ambulatory Visit (INDEPENDENT_AMBULATORY_CARE_PROVIDER_SITE_OTHER): Payer: Medicare Other | Admitting: Orthopaedic Surgery

## 2016-12-29 ENCOUNTER — Ambulatory Visit (INDEPENDENT_AMBULATORY_CARE_PROVIDER_SITE_OTHER): Payer: No Typology Code available for payment source

## 2016-12-29 ENCOUNTER — Encounter (INDEPENDENT_AMBULATORY_CARE_PROVIDER_SITE_OTHER): Payer: Self-pay | Admitting: Orthopaedic Surgery

## 2016-12-29 VITALS — BP 129/81 | HR 81 | Ht 62.0 in | Wt 135.0 lb

## 2016-12-29 DIAGNOSIS — S82401E Unspecified fracture of shaft of right fibula, subsequent encounter for open fracture type I or II with routine healing: Secondary | ICD-10-CM

## 2016-12-29 DIAGNOSIS — S82201E Unspecified fracture of shaft of right tibia, subsequent encounter for open fracture type I or II with routine healing: Secondary | ICD-10-CM

## 2016-12-29 NOTE — Progress Notes (Signed)
   Post-Op Visit Note   Patient: Ariana Patton           Date of Birth: 07/06/1945           MRN: [id number] Visit Date: 12/29/2016 PCP: Patient, No Pcp Per   Assessment & Plan: Tibial nonunion, right.  Plan will be admission for compression plate fixation.  Patient has gross obvious motion at the fracture site plan will be stabilization to attempt to get union with plate fixation.  Chief Complaint:  Chief Complaint  Patient presents with  . Right Leg - Follow-up, Fracture   Visit Diagnoses:  1. Type I or II open fracture of right tibia and fibula with routine healing, subsequent encounter     Plan: Patient returns post grade 2 open tib-fib treated with external fixator on 10/13/2016 due to her open fracture and multiple fracture blisters. , Followed by removal of external fixator due to loosening with cast application on 11/15/2016.  She was placed in a fiberglass cast.  Today she returns cast is removed x-rays demonstrate she has not made any bone at the fracture site and still has gross obvious motion consistent with nonunion.  Patient has a total knee arthroplasty present.  She basically just stands to pivot for transfers.  She has had total knee arthroplasty and then had a femur fracture and has a lateral femoral plate present.  Plan will be a tibial plate for nonunion.  She will likely be in the hospital for 2 nights and then can go back to the skilled facility.  Normal treatment would be intramedullary rod however presence of total knee arthroplasty blocks the entry site for that choice of treatment.  Skin is healed.  She is placed in a cam boot pending surgical scheduling.  Her son Ariana Patton has power of attorney and I will need to discuss this with him for consent.  Follow-Up Instructions: No Follow-up on file.   Orders:  Orders Placed This Encounter  Procedures  . XR Tibia/Fibula Right   No orders of the defined types were placed in this encounter.   Imaging: No results  found.  PMFS History: Patient Active Problem List   Diagnosis Date Noted  . Open fracture of right tibia and fibula 10/14/2016   Past Medical History:  Diagnosis Date  . Depression   . Multiple sclerosis ([facility])     No family history on file.  Past Surgical History:  Procedure Laterality Date  . CAST APPLICATION Right 11/15/2016   Procedure: FIBERGLASS CAST APPLICATION RIGHT TIBIA/FIBULA FRACTURE;  Surgeon: Eldred MangesYates, Roshaunda Starkey C, MD;  Location: [facility] OR;  Service: Orthopedics;  Laterality: Right;  . CHOLECYSTECTOMY    . EXTERNAL FIXATION LEG Right 10/13/2016   Procedure: Application of external fixator Right lower leg;  Surgeon: Eldred MangesYates, Blaize Nipper C, MD;  [facility];  Service: Orthopedics;  Laterality: Right;  . EXTERNAL FIXATION REMOVAL Right 11/15/2016   Procedure: REMOVAL EXTERNAL FIXATION RIGHT TIBIA/FIBULA;  Surgeon: Eldred MangesYates, Elmore Hyslop C, MD;  Location: [facility] OR;  Service: Orthopedics;  Laterality: Right;   Social History   Occupational History  . Not on file.   Social History Main Topics  . Smoking status: Never Smoker  . Smokeless tobacco: Never Used  . Alcohol use No  . Drug use: No  . Sexual activity: Not on file

## 2017-01-05 ENCOUNTER — Telehealth (INDEPENDENT_AMBULATORY_CARE_PROVIDER_SITE_OTHER): Payer: Self-pay | Admitting: Orthopaedic Surgery

## 2017-01-05 ENCOUNTER — Encounter (HOSPITAL_COMMUNITY): Payer: Self-pay | Admitting: *Deleted

## 2017-01-05 NOTE — Telephone Encounter (Signed)
Called 01/05/17 @ 2:32 to Ariana Patton [phone] and spoke with Ariana Patton (assigned nurse). She is aware of Surgery location [facility]), date 01-08-17 and time 3:25pm , including arrival time of 1:00pm.

## 2017-01-05 NOTE — Progress Notes (Addendum)
Pt resides at [facility]. Spoke with Delmar Landau, LPN, nurse for pt. She verified allergies and medical history. She states pt is alert, oriented and can speak for herself. She states pt is type 2 diabetic, they do not have an A1C on her and they do not check her blood sugar at all. Pt will be arriving via transportation from [facility]. Faxed pre-op instructions to [facility] at [phone].

## 2017-01-05 NOTE — Pre-Procedure Instructions (Signed)
    Aldona BarConstance Buffa  01/05/2017    Ms. Cizek procedure is scheduled on Monday, Nov, 12, 2018 at 3:25 PM.   [facility]" Admitting Office at 1:00 PM.   Call this number if you have problems the morning of surgery: [phone]   Remember:  Patient is not to eat food or drink liquids after midnight Sunday, 01/07/17.  Take these medicines the morning of surgery with A SIP OF WATER: Bupropion (Wellbutrin), Gabapentin (Neurontin), Omeprazole (Prilosec), Hydrocodone - if needed, may use Flonase  Stop NSAIDS (Celebrex, Ibuprofen, Aleve, etc) and Aspirin as of today.   Do not wear jewelry, make-up or nail polish.  Do not wear lotions, powders, perfumes or deodorant.  Do not shave 48 hours prior to surgery.  Men may shave face and neck.  Do not bring valuables to the hospital.  [facility] any belongings or valuables.  Contacts, dentures or bridgework may not be worn into surgery.  For patients admitted to the hospital, discharge time will be determined by your treatment tea  If any questions after receiving this, call me, Hyman Bibleeresa Tyrene Nader, RN at [phone].

## 2017-01-08 ENCOUNTER — Inpatient Hospital Stay (HOSPITAL_COMMUNITY): Payer: Medicare Other | Admitting: Certified Registered"

## 2017-01-08 ENCOUNTER — Other Ambulatory Visit: Payer: Self-pay

## 2017-01-08 ENCOUNTER — Inpatient Hospital Stay (HOSPITAL_COMMUNITY): Payer: Medicare Other

## 2017-01-08 ENCOUNTER — Encounter (HOSPITAL_COMMUNITY)
Admission: RE | Disposition: A | Discharge status: Skilled Nursing Facility | Payer: Self-pay | Source: Ambulatory Visit | Attending: Orthopaedic Surgery

## 2017-01-08 ENCOUNTER — Encounter (HOSPITAL_COMMUNITY): Payer: Self-pay | Admitting: Surgery

## 2017-01-08 ENCOUNTER — Inpatient Hospital Stay (HOSPITAL_COMMUNITY)
Admission: RE | Admit: 2017-01-08 | Discharge: 2017-01-11 | DRG: 494 | Disposition: A | Discharge status: Skilled Nursing Facility | Payer: Medicare Other | Source: Ambulatory Visit | Attending: Orthopaedic Surgery | Admitting: Orthopaedic Surgery

## 2017-01-08 DIAGNOSIS — Z9049 Acquired absence of other specified parts of digestive tract: Secondary | ICD-10-CM

## 2017-01-08 DIAGNOSIS — F329 Major depressive disorder, single episode, unspecified: Secondary | ICD-10-CM | POA: Diagnosis present

## 2017-01-08 DIAGNOSIS — S82401K Unspecified fracture of shaft of right fibula, subsequent encounter for closed fracture with nonunion: Secondary | ICD-10-CM

## 2017-01-08 DIAGNOSIS — K219 Gastro-esophageal reflux disease without esophagitis: Secondary | ICD-10-CM | POA: Diagnosis present

## 2017-01-08 DIAGNOSIS — E119 Type 2 diabetes mellitus without complications: Secondary | ICD-10-CM | POA: Diagnosis present

## 2017-01-08 DIAGNOSIS — G35 Multiple sclerosis: Secondary | ICD-10-CM | POA: Diagnosis present

## 2017-01-08 DIAGNOSIS — Z419 Encounter for procedure for purposes other than remedying health state, unspecified: Secondary | ICD-10-CM

## 2017-01-08 DIAGNOSIS — S82201A Unspecified fracture of shaft of right tibia, initial encounter for closed fracture: Secondary | ICD-10-CM | POA: Diagnosis present

## 2017-01-08 DIAGNOSIS — S82201K Unspecified fracture of shaft of right tibia, subsequent encounter for closed fracture with nonunion: Secondary | ICD-10-CM | POA: Diagnosis present

## 2017-01-08 DIAGNOSIS — S82401A Unspecified fracture of shaft of right fibula, initial encounter for closed fracture: Secondary | ICD-10-CM | POA: Diagnosis present

## 2017-01-08 DIAGNOSIS — E559 Vitamin D deficiency, unspecified: Secondary | ICD-10-CM | POA: Diagnosis present

## 2017-01-08 DIAGNOSIS — S82201S Unspecified fracture of shaft of right tibia, sequela: Secondary | ICD-10-CM

## 2017-01-08 DIAGNOSIS — L8990 Pressure ulcer of unspecified site, unspecified stage: Secondary | ICD-10-CM

## 2017-01-08 DIAGNOSIS — L89151 Pressure ulcer of sacral region, stage 1: Secondary | ICD-10-CM | POA: Diagnosis present

## 2017-01-08 HISTORY — DX: Vitamin D deficiency, unspecified: E55.9

## 2017-01-08 HISTORY — PX: ORIF TIBIA FRACTURE: SHX5416

## 2017-01-08 HISTORY — DX: Dizziness and giddiness: R42

## 2017-01-08 HISTORY — DX: Multiple sclerosis: G35

## 2017-01-08 HISTORY — DX: Gastro-esophageal reflux disease without esophagitis: K21.9

## 2017-01-08 LAB — BASIC METABOLIC PANEL
Anion gap: 8 (ref 5–15)
BUN: 12 mg/dL (ref 6–20)
CO2: 25 mmol/L (ref 22–32)
CREATININE: 0.61 mg/dL (ref 0.44–1.00)
Calcium: 9.3 mg/dL (ref 8.9–10.3)
Chloride: 105 mmol/L (ref 101–111)
GFR calc Af Amer: >60 mL/min (ref >60)
GLUCOSE: 94 mg/dL (ref 65–99)
POTASSIUM: 4.7 mmol/L (ref 3.5–5.1)
Sodium: 138 mmol/L (ref 135–145)

## 2017-01-08 LAB — HEMOGLOBIN A1C
Hgb A1c MFr Bld: 5.1 % (ref 4.8–5.6)
Mean Plasma Glucose: 99.67 mg/dL

## 2017-01-08 LAB — CBC
HCT: 40.6 % (ref 36.0–46.0)
Hemoglobin: 13.3 g/dL (ref 12.0–15.0)
MCH: 29.7 pg (ref 26.0–34.0)
MCHC: 32.8 g/dL (ref 30.0–36.0)
MCV: 90.6 fL (ref 78.0–100.0)
PLATELETS: 344 10*3/uL (ref 150–400)
RBC: 4.48 MIL/uL (ref 3.87–5.11)
RDW: 13.1 % (ref 11.5–15.5)
WBC: 7.4 10*3/uL (ref 4.0–10.5)

## 2017-01-08 SURGERY — OPEN REDUCTION INTERNAL FIXATION (ORIF) TIBIA FRACTURE
Anesthesia: General | Laterality: Right

## 2017-01-08 MED ORDER — DEXAMETHASONE SODIUM PHOSPHATE 10 MG/ML IJ SOLN
INTRAMUSCULAR | Status: AC
  Filled 2017-01-08: qty 1

## 2017-01-08 MED ORDER — ONDANSETRON HCL 4 MG PO TABS: 4.0000 mg | ORAL_TABLET | Freq: Four times a day (QID) | ORAL | Status: DC | PRN

## 2017-01-08 MED ORDER — ONDANSETRON HCL 4 MG/2ML IJ SOLN
INTRAMUSCULAR | Status: DC | PRN
  Administered 2017-01-08: 4 mg via INTRAVENOUS

## 2017-01-08 MED ORDER — PHENYLEPHRINE HCL 10 MG/ML IJ SOLN
INTRAVENOUS | Status: DC | PRN
  Administered 2017-01-08: 40 ug/min via INTRAVENOUS

## 2017-01-08 MED ORDER — FENTANYL CITRATE (PF) 100 MCG/2ML IJ SOLN
INTRAMUSCULAR | Status: DC | PRN
  Administered 2017-01-08: 50 ug via INTRAVENOUS
  Administered 2017-01-08: 25 ug via INTRAVENOUS
  Administered 2017-01-08 (×2): 50 ug via INTRAVENOUS

## 2017-01-08 MED ORDER — FLUTICASONE PROPIONATE 50 MCG/ACT NA SUSP
1.0000 | Freq: Two times a day (BID) | NASAL | Status: DC
  Administered 2017-01-08 – 2017-01-11 (×5): 1 via NASAL
  Filled 2017-01-08: qty 16

## 2017-01-08 MED ORDER — POLYETHYLENE GLYCOL 3350 17 G PO PACK: 17.0000 g | PACK | Freq: Every day | ORAL | Status: DC | PRN

## 2017-01-08 MED ORDER — FENTANYL CITRATE (PF) 100 MCG/2ML IJ SOLN
25.0000 ug | INTRAMUSCULAR | Status: DC | PRN
  Administered 2017-01-08 (×2): 25 ug via INTRAVENOUS

## 2017-01-08 MED ORDER — PHENYLEPHRINE 40 MCG/ML (10ML) SYRINGE FOR IV PUSH (FOR BLOOD PRESSURE SUPPORT)
PREFILLED_SYRINGE | INTRAVENOUS | Status: AC
  Filled 2017-01-08: qty 10

## 2017-01-08 MED ORDER — ACETAMINOPHEN 325 MG PO TABS
650.0000 mg | ORAL_TABLET | ORAL | Status: DC | PRN
  Administered 2017-01-11: 650 mg via ORAL
  Filled 2017-01-08: qty 2

## 2017-01-08 MED ORDER — ROCURONIUM BROMIDE 10 MG/ML (PF) SYRINGE
PREFILLED_SYRINGE | INTRAVENOUS | Status: DC | PRN
  Administered 2017-01-08: 50 mg via INTRAVENOUS
  Administered 2017-01-08: 10 mg via INTRAVENOUS

## 2017-01-08 MED ORDER — CEFAZOLIN SODIUM-DEXTROSE 2-4 GM/100ML-% IV SOLN
2.0000 g | INTRAVENOUS | Status: AC
  Administered 2017-01-08: 2 g via INTRAVENOUS

## 2017-01-08 MED ORDER — VITAMIN D (ERGOCALCIFEROL) 1.25 MG (50000 UNIT) PO CAPS: 50000.0000 [IU] | ORAL_CAPSULE | ORAL | Status: DC

## 2017-01-08 MED ORDER — ONDANSETRON HCL 4 MG/2ML IJ SOLN: 4.0000 mg | Freq: Four times a day (QID) | INTRAMUSCULAR | Status: DC | PRN

## 2017-01-08 MED ORDER — MIRABEGRON ER 25 MG PO TB24
25.0000 mg | ORAL_TABLET | Freq: Every day | ORAL | Status: DC
  Administered 2017-01-08 – 2017-01-11 (×4): 25 mg via ORAL
  Filled 2017-01-08 (×4): qty 1

## 2017-01-08 MED ORDER — METOCLOPRAMIDE HCL 5 MG PO TABS: 5.0000 mg | ORAL_TABLET | Freq: Three times a day (TID) | ORAL | Status: DC | PRN

## 2017-01-08 MED ORDER — LACTATED RINGERS IV SOLN
INTRAVENOUS | Status: DC
  Administered 2017-01-08: 15:00:00 via INTRAVENOUS

## 2017-01-08 MED ORDER — HYDROCODONE-ACETAMINOPHEN 10-325 MG PO TABS
1.0000 | ORAL_TABLET | Freq: Four times a day (QID) | ORAL | Status: DC | PRN
  Administered 2017-01-08 – 2017-01-11 (×8): 1 via ORAL
  Filled 2017-01-08 (×8): qty 1

## 2017-01-08 MED ORDER — SODIUM CHLORIDE 0.9 % IV SOLN
INTRAVENOUS | Status: DC
  Administered 2017-01-08: 21:00:00 via INTRAVENOUS

## 2017-01-08 MED ORDER — PROPOFOL 10 MG/ML IV BOLUS
INTRAVENOUS | Status: DC | PRN
  Administered 2017-01-08: 150 mg via INTRAVENOUS

## 2017-01-08 MED ORDER — PANTOPRAZOLE SODIUM 40 MG PO TBEC
40.0000 mg | DELAYED_RELEASE_TABLET | Freq: Every day | ORAL | Status: DC
  Administered 2017-01-08 – 2017-01-11 (×4): 40 mg via ORAL
  Filled 2017-01-08 (×4): qty 1

## 2017-01-08 MED ORDER — LIDOCAINE 2% (20 MG/ML) 5 ML SYRINGE
INTRAMUSCULAR | Status: DC | PRN
  Administered 2017-01-08: 60 mg via INTRAVENOUS

## 2017-01-08 MED ORDER — DEXAMETHASONE SODIUM PHOSPHATE 10 MG/ML IJ SOLN
INTRAMUSCULAR | Status: DC | PRN
  Administered 2017-01-08: 10 mg via INTRAVENOUS

## 2017-01-08 MED ORDER — OXYCODONE HCL 5 MG PO TABS: 5.0000 mg | ORAL_TABLET | Freq: Once | ORAL | Status: DC | PRN

## 2017-01-08 MED ORDER — CHOLESTYRAMINE LIGHT 4 G PO PACK
4.0000 g | PACK | Freq: Two times a day (BID) | ORAL | Status: DC
  Administered 2017-01-08 – 2017-01-11 (×6): 4 g via ORAL
  Filled 2017-01-08 (×7): qty 1

## 2017-01-08 MED ORDER — SUGAMMADEX SODIUM 200 MG/2ML IV SOLN
INTRAVENOUS | Status: DC | PRN
  Administered 2017-01-08: 120 mg via INTRAVENOUS

## 2017-01-08 MED ORDER — BUPROPION HCL ER (SR) 150 MG PO TB12
150.0000 mg | ORAL_TABLET | Freq: Two times a day (BID) | ORAL | Status: DC
  Administered 2017-01-08 – 2017-01-11 (×6): 150 mg via ORAL
  Filled 2017-01-08 (×6): qty 1

## 2017-01-08 MED ORDER — METOCLOPRAMIDE HCL 5 MG/ML IJ SOLN: 5.0000 mg | Freq: Three times a day (TID) | INTRAMUSCULAR | Status: DC | PRN

## 2017-01-08 MED ORDER — ROCURONIUM BROMIDE 10 MG/ML (PF) SYRINGE
PREFILLED_SYRINGE | INTRAVENOUS | Status: AC
  Filled 2017-01-08: qty 10

## 2017-01-08 MED ORDER — GABAPENTIN 300 MG PO CAPS
300.0000 mg | ORAL_CAPSULE | Freq: Three times a day (TID) | ORAL | Status: DC
  Administered 2017-01-08 – 2017-01-11 (×9): 300 mg via ORAL
  Filled 2017-01-08 (×9): qty 1

## 2017-01-08 MED ORDER — CEFAZOLIN SODIUM 1 G IJ SOLR
INTRAMUSCULAR | Status: AC
  Filled 2017-01-08: qty 20

## 2017-01-08 MED ORDER — ACETAMINOPHEN 650 MG RE SUPP: 650.0000 mg | RECTAL | Status: DC | PRN

## 2017-01-08 MED ORDER — ONDANSETRON HCL 4 MG/2ML IJ SOLN
INTRAMUSCULAR | Status: AC
  Filled 2017-01-08: qty 2

## 2017-01-08 MED ORDER — FENTANYL CITRATE (PF) 100 MCG/2ML IJ SOLN
INTRAMUSCULAR | Status: AC
  Administered 2017-01-08: 25 ug via INTRAVENOUS
  Filled 2017-01-08: qty 2

## 2017-01-08 MED ORDER — VANCOMYCIN HCL IN DEXTROSE 1-5 GM/200ML-% IV SOLN
1000.0000 mg | Freq: Two times a day (BID) | INTRAVENOUS | Status: AC
  Administered 2017-01-08: 1000 mg via INTRAVENOUS
  Filled 2017-01-08: qty 200

## 2017-01-08 MED ORDER — CEFAZOLIN SODIUM-DEXTROSE 2-4 GM/100ML-% IV SOLN
INTRAVENOUS | Status: AC
  Filled 2017-01-08: qty 100

## 2017-01-08 MED ORDER — OXYCODONE HCL 5 MG/5ML PO SOLN: 5.0000 mg | Freq: Once | ORAL | Status: DC | PRN

## 2017-01-08 MED ORDER — ONDANSETRON HCL 4 MG/2ML IJ SOLN: 4.0000 mg | Freq: Once | INTRAMUSCULAR | Status: DC | PRN

## 2017-01-08 MED ORDER — FENTANYL CITRATE (PF) 250 MCG/5ML IJ SOLN
INTRAMUSCULAR | Status: AC
  Filled 2017-01-08: qty 5

## 2017-01-08 MED ORDER — PROPOFOL 10 MG/ML IV BOLUS
INTRAVENOUS | Status: AC
  Filled 2017-01-08: qty 20

## 2017-01-08 MED ORDER — CHLORHEXIDINE GLUCONATE 4 % EX LIQD: 60.0000 mL | Freq: Once | CUTANEOUS | Status: DC

## 2017-01-08 MED ORDER — DOCUSATE SODIUM 100 MG PO CAPS
100.0000 mg | ORAL_CAPSULE | Freq: Two times a day (BID) | ORAL | Status: DC
  Administered 2017-01-08 – 2017-01-11 (×5): 100 mg via ORAL
  Filled 2017-01-08 (×6): qty 1

## 2017-01-08 MED ORDER — LIDOCAINE 2% (20 MG/ML) 5 ML SYRINGE
INTRAMUSCULAR | Status: AC
  Filled 2017-01-08: qty 10

## 2017-01-08 MED ORDER — MECLIZINE HCL 25 MG PO TABS
25.0000 mg | ORAL_TABLET | Freq: Three times a day (TID) | ORAL | Status: DC | PRN
  Filled 2017-01-08: qty 1

## 2017-01-08 MED ORDER — PHENYLEPHRINE 40 MCG/ML (10ML) SYRINGE FOR IV PUSH (FOR BLOOD PRESSURE SUPPORT)
PREFILLED_SYRINGE | INTRAVENOUS | Status: DC | PRN
  Administered 2017-01-08: 160 ug via INTRAVENOUS
  Administered 2017-01-08 (×2): 80 ug via INTRAVENOUS

## 2017-01-08 MED ORDER — HYDROMORPHONE HCL 1 MG/ML IJ SOLN
0.5000 mg | INTRAMUSCULAR | Status: DC | PRN
  Administered 2017-01-08 – 2017-01-09 (×3): 0.5 mg via INTRAVENOUS
  Filled 2017-01-08 (×3): qty 1

## 2017-01-08 SURGICAL SUPPLY — 69 items
BANDAGE ACE 4X5 VEL STRL LF (GAUZE/BANDAGES/DRESSINGS) ×2 IMPLANT
BANDAGE ACE 6X5 VEL STRL LF (GAUZE/BANDAGES/DRESSINGS) ×2 IMPLANT
BANDAGE ESMARK 6X9 LF (GAUZE/BANDAGES/DRESSINGS) IMPLANT
BIT DRILL 3.3MM (BIT) IMPLANT
BLADE AVERAGE 25MMX9MM (BLADE) ×1
BLADE AVERAGE 25X9 (BLADE) ×1 IMPLANT
BNDG CMPR 9X6 STRL LF SNTH (GAUZE/BANDAGES/DRESSINGS)
BNDG COHESIVE 6X5 TAN STRL LF (GAUZE/BANDAGES/DRESSINGS) ×3 IMPLANT
BNDG ESMARK 6X9 LF (GAUZE/BANDAGES/DRESSINGS)
BNDG GAUZE ELAST 4 BULKY (GAUZE/BANDAGES/DRESSINGS) ×2 IMPLANT
CAP LOCK NCB (Cap) ×4 IMPLANT
COVER MAYO STAND STRL (DRAPES) ×3 IMPLANT
COVER SURGICAL LIGHT HANDLE (MISCELLANEOUS) ×3 IMPLANT
CUFF TOURNIQUET SINGLE 34IN LL (TOURNIQUET CUFF) ×2 IMPLANT
CUFF TOURNIQUET SINGLE 44IN (TOURNIQUET CUFF) IMPLANT
DRAPE C-ARM 42X72 X-RAY (DRAPES) ×2 IMPLANT
DRAPE C-ARMOR (DRAPES) ×3 IMPLANT
DRAPE HALF SHEET 40X57 (DRAPES) ×3 IMPLANT
DRAPE INCISE IOBAN 66X45 STRL (DRAPES) ×3 IMPLANT
DRAPE U-SHAPE 47X51 STRL (DRAPES) ×3 IMPLANT
DRILL 3.3MM (BIT) ×3
DRSG PAD ABDOMINAL 8X10 ST (GAUZE/BANDAGES/DRESSINGS) ×3 IMPLANT
DURAPREP 26ML APPLICATOR (WOUND CARE) ×3 IMPLANT
ELECT REM PT RETURN 9FT ADLT (ELECTROSURGICAL) ×3
ELECTRODE REM PT RTRN 9FT ADLT (ELECTROSURGICAL) ×1 IMPLANT
EVACUATOR 1/8 PVC DRAIN (DRAIN) ×2 IMPLANT
GAUZE SPONGE 4X4 12PLY STRL (GAUZE/BANDAGES/DRESSINGS) ×3 IMPLANT
GAUZE XEROFORM 5X9 LF (GAUZE/BANDAGES/DRESSINGS) ×3 IMPLANT
GLOVE BIOGEL PI IND STRL 8 (GLOVE) ×2 IMPLANT
GLOVE BIOGEL PI INDICATOR 8 (GLOVE) ×4
GLOVE ORTHO TXT STRL SZ7.5 (GLOVE) ×6 IMPLANT
GOWN STRL REUS W/ TWL LRG LVL3 (GOWN DISPOSABLE) ×1 IMPLANT
GOWN STRL REUS W/ TWL XL LVL3 (GOWN DISPOSABLE) ×1 IMPLANT
GOWN STRL REUS W/TWL 2XL LVL3 (GOWN DISPOSABLE) ×3 IMPLANT
GOWN STRL REUS W/TWL LRG LVL3 (GOWN DISPOSABLE) ×3
GOWN STRL REUS W/TWL XL LVL3 (GOWN DISPOSABLE) ×3
GUIDEWIRE ORTH 6X062XTROC NS (WIRE) IMPLANT
K-WIRE .062 (WIRE) ×3
KIT BASIN OR (CUSTOM PROCEDURE TRAY) ×3 IMPLANT
KIT ROOM TURNOVER OR (KITS) ×3 IMPLANT
MANIFOLD NEPTUNE II (INSTRUMENTS) ×3 IMPLANT
NS IRRIG 1000ML POUR BTL (IV SOLUTION) ×3 IMPLANT
PACK ORTHO EXTREMITY (CUSTOM PROCEDURE TRAY) ×3 IMPLANT
PAD ARMBOARD 7.5X6 YLW CONV (MISCELLANEOUS) ×6 IMPLANT
PAD CAST 4YDX4 CTTN HI CHSV (CAST SUPPLIES) ×1 IMPLANT
PADDING CAST COTTON 4X4 STRL (CAST SUPPLIES)
PADDING CAST COTTON 6X4 STRL (CAST SUPPLIES) ×3 IMPLANT
PLATE NCB STRT PA 146 10H (Plate) ×2 IMPLANT
SCREW CORTICAL 5.0X22MM (Screw) ×2 IMPLANT
SCREW NCB 5.0MMX24 (Screw) ×6 IMPLANT
SCREW NCB 5.0X26MM (Screw) ×2 IMPLANT
SCREW NCB 5.0X28MM (Screw) ×6 IMPLANT
SCREW NCB 5.0X30MM (Screw) ×6 IMPLANT
SCREW NCB 5.0X34MM (Screw) ×2 IMPLANT
SCREW NCB 5.0X36MM (Screw) ×2 IMPLANT
SPONGE LAP 18X18 X RAY DECT (DISPOSABLE) ×3 IMPLANT
STAPLER VISISTAT 35W (STAPLE) IMPLANT
SUCTION FRAZIER HANDLE 10FR (MISCELLANEOUS) ×2
SUCTION TUBE FRAZIER 10FR DISP (MISCELLANEOUS) ×1 IMPLANT
SUT ETHILON 2 0 PSLX (SUTURE) ×2 IMPLANT
SUT ETHILON 3 0 PS 1 (SUTURE) ×6 IMPLANT
SUT VIC AB 2-0 CT1 27 (SUTURE) ×6
SUT VIC AB 2-0 CT1 TAPERPNT 27 (SUTURE) ×2 IMPLANT
TOWEL OR 17X24 6PK STRL BLUE (TOWEL DISPOSABLE) ×3 IMPLANT
TOWEL OR 17X26 10 PK STRL BLUE (TOWEL DISPOSABLE) ×3 IMPLANT
TUBE CONNECTING 12'X1/4 (SUCTIONS) ×1
TUBE CONNECTING 12X1/4 (SUCTIONS) ×2 IMPLANT
WATER STERILE IRR 1000ML POUR (IV SOLUTION) ×3 IMPLANT
YANKAUER SUCT BULB TIP NO VENT (SUCTIONS) ×3 IMPLANT

## 2017-01-08 NOTE — Anesthesia Procedure Notes (Signed)
Procedure Name: Intubation Date/Time: 01/08/2017 3:30 PM Performed by: Elliot Dally, CRNA Pre-anesthesia Checklist: Patient identified, Emergency Drugs available, Suction available and Patient being monitored Patient Re-evaluated:Patient Re-evaluated prior to induction Oxygen Delivery Method: Circle System Utilized Preoxygenation: Pre-oxygenation with 100% oxygen Induction Type: IV induction Ventilation: Mask ventilation without difficulty Laryngoscope Size: Miller and 2 Grade View: Grade I Tube type: Oral Tube size: 7.0 mm Number of attempts: 1 Airway Equipment and Method: Stylet and Oral airway Placement Confirmation: ETT inserted through vocal cords under direct vision,  positive ETCO2 and breath sounds checked- equal and bilateral Secured at: 21 cm Tube secured with: Tape Dental Injury: Teeth and Oropharynx as per pre-operative assessment

## 2017-01-08 NOTE — Transfer of Care (Signed)
Immediate Anesthesia Transfer of Care Note  Patient: Ariana Patton  Procedure(s) Performed: OPEN REDUCTION INTERNAL FIXATION (ORIF) RIGHT TIBIA FRACTURE, COMPRESSION PLATE (Right )  Patient Location: PACU  Anesthesia Type:General  Level of Consciousness: awake, alert  and oriented  Airway & Oxygen Therapy: Patient Spontanous Breathing and Patient connected to nasal cannula oxygen  Post-op Assessment: Report given to RN and Post -op Vital signs reviewed and stable  Post vital signs: Reviewed and stable  Last Vitals:  Vitals:   01/08/17 1331 01/08/17 1715  BP: 119/77 (P) 123/69  Pulse:  (P) 89  Resp:  (P) 16  Temp:  (P) 36.5 C  SpO2:  (P) 99%    Last Pain:  Vitals:   01/08/17 1346  TempSrc:   PainSc: 8       Patients Stated Pain Goal: 3 (01/08/17 1346)  Complications: No apparent anesthesia complications

## 2017-01-08 NOTE — Anesthesia Postprocedure Evaluation (Signed)
Anesthesia Post Note  Patient: Ariana Patton  Procedure(s) Performed: OPEN REDUCTION INTERNAL FIXATION (ORIF) RIGHT TIBIA FRACTURE, COMPRESSION PLATE (Right )     Patient location during evaluation: PACU Anesthesia Type: General Level of consciousness: awake, awake and alert and oriented Pain management: pain level controlled Vital Signs Assessment: post-procedure vital signs reviewed and stable Respiratory status: spontaneous breathing, nonlabored ventilation and respiratory function stable Cardiovascular status: blood pressure returned to baseline Anesthetic complications: no    Last Vitals:  Vitals:   01/08/17 1745 01/08/17 1800  BP: (!) 109/57 101/67  Pulse: 88 86  Resp: 16 16  Temp:    SpO2: 97% 97%    Last Pain:  Vitals:   01/08/17 1800  TempSrc:   PainSc: 0-No pain                 Genieve Ramaswamy COKER

## 2017-01-08 NOTE — Anesthesia Preprocedure Evaluation (Addendum)
Anesthesia Evaluation  Patient identified by MRN, date of birth, ID band Patient awake    Reviewed: Allergy & Precautions, NPO status , Patient's Chart, lab work & pertinent test results  History of Anesthesia Complications (+) POST - OP SPINAL HEADACHE  Airway Mallampati: III  TM Distance: <3 FB Neck ROM: Limited    Dental  (+) Dental Advisory Given, Partial Upper, Edentulous Lower   Pulmonary neg pulmonary ROS,    breath sounds clear to auscultation       Cardiovascular  Rhythm:Regular Rate:Normal     Neuro/Psych Depression Multiple sclerosis    GI/Hepatic Neg liver ROS, GERD  Controlled,  Endo/Other  negative endocrine ROS  Renal/GU negative Renal ROS  negative genitourinary   Musculoskeletal Multiple Sclerosis   Abdominal   Peds  Hematology negative hematology ROS (+)   Anesthesia Other Findings   Reproductive/Obstetrics negative OB ROS                         Anesthesia Physical Anesthesia Plan  ASA: II  Anesthesia Plan:    Post-op Pain Management:    Induction:   PONV Risk Score and Plan:   Airway Management Planned:   Additional Equipment:   Intra-op Plan:   Post-operative Plan:   Informed Consent:   Plan Discussed with:   Anesthesia Plan Comments:         Anesthesia Quick Evaluation

## 2017-01-08 NOTE — Brief Op Note (Signed)
01/08/2017  5:08 PM  PATIENT:  Ariana Patton  71 y.o. female  PRE-OPERATIVE DIAGNOSIS:  Right  Tibia/Fibula Non-Union  POST-OPERATIVE DIAGNOSIS:  Right  Tibia/Fibula Non-Union  PROCEDURE:  Procedure(s): OPEN REDUCTION INTERNAL FIXATION (ORIF) RIGHT TIBIA FRACTURE, COMPRESSION PLATE (Right)  SURGEON:  Surgeon(s) and Role:    Eldred Manges* Starr Engel C, MD - Primary  PHYSICIAN ASSISTANT: Zonia KiefJames Owens PA-C  ASSISTANTS:  ANESTHESIA:   general  EBL:  30 mL   BLOOD ADMINISTERED:none  DRAINS: Hemovac right tibia   LOCAL MEDICATIONS USED:  NONE  SPECIMEN:  No Specimen  DISPOSITION OF SPECIMEN:  N/A  COUNTS:  YES  TOURNIQUET:   Total Tourniquet Time Documented: Thigh (Right) - 50 minutes Total: Thigh (Right) - 50 minutes   DICTATION: .Reubin Milanragon Dictation  PLAN OF CARE: Admit to inpatient   PATIENT DISPOSITION:  PACU - hemodynamically stable.   Delay start of Pharmacological VTE agent (>24hrs) due to surgical blood loss or risk of bleeding: yes

## 2017-01-08 NOTE — Interval H&P Note (Signed)
History and Physical Interval Note:  01/08/2017 2:43 PM  Ariana BarConstance Patton  has presented today for surgery, with the diagnosis of Right  Tibia/Fibula Non-Union  The various methods of treatment have been discussed with the patient and family. After consideration of risks, benefits and other options for treatment, the patient has consented to  Procedure(s): OPEN REDUCTION INTERNAL FIXATION (ORIF) RIGHT TIBIA FRACTURE, COMPRESSION PLATE (Right) as a surgical intervention .  The patient's history has been reviewed, patient examined, no change in status, stable for surgery.  I have reviewed the patient's chart and labs.  Questions were answered to the patient's satisfaction.     Eldred MangesMark C Zekiah Caruth

## 2017-01-08 NOTE — H&P (Signed)
Ariana Patton is an 71 y.o. female.   Chief Complaint: right tibia fracture HPI: patient with right tibia and fibula fracture presents for surgical intervention.    Past Medical History:  Diagnosis Date  . Depression   . Diabetes mellitus without complication (HCC)    type 2  . Dizziness   . GERD (gastroesophageal reflux disease)   . MS (multiple sclerosis) (HCC)   . Multiple sclerosis (HCC)   . Vitamin D deficiency     Past Surgical History:  Procedure Laterality Date  . CHOLECYSTECTOMY      No family history on file. Social History:  reports that  has never smoked. she has never used smokeless tobacco. She reports that she does not drink alcohol or use drugs.  Allergies: No Known Allergies  No medications prior to admission.    No results found for this or any previous visit (from the past 48 hour(s)). No results found.  Review of Systems  Constitutional: Negative.   HENT: Negative.   Respiratory: Negative.   Cardiovascular: Negative.   Gastrointestinal: Negative.   Genitourinary: Negative.   Musculoskeletal:       Right leg pain  Skin: Negative.   Neurological: Negative.   Psychiatric/Behavioral: Negative.     There were no vitals taken for this visit. Physical Exam  Constitutional: She is oriented to person, place, and time. She appears well-developed.  HENT:  Head: Normocephalic and atraumatic.  Eyes: EOM are normal. Pupils are equal, round, and reactive to light.  Neck: Normal range of motion.  Respiratory: No respiratory distress.  GI: She exhibits no distension.  Musculoskeletal: She exhibits tenderness.  Neurological: She is alert and oriented to person, place, and time.  Skin: Skin is warm and dry.  Psychiatric: She has a normal mood and affect.     Assessment/Plan  Tibial nonunion, right.   Will proceed with OPEN REDUCTION INTERNAL FIXATION (ORIF) RIGHT TIBIA FRACTURE, COMPRESSION PLATE as scheduled.  Surgical procedure discussed along with  possible risks and complications.  All questions answered and wishes to proceed.    Zonia Kief, PA-C 01/08/2017, 11:45 AM

## 2017-01-08 NOTE — Op Note (Addendum)
Preop diagnosis: Right tib-fib shaft nonunion.  Postop diagnosis: Same  Procedure: Takedown nonunion compression plate fixation right tibia fracture.  Surgeon:  Annell Greening MD  Assistant: Zonia Kief  PA-C medically necessary and present for the entire procedure  Anesthesia: General  Tourniquet: 350 pressure times 50 minutes.  Implant Zimmer 10 hole compression plate with 5 mm screws x10  Operative procedure: After removal of the patient's cam boot leg was scrubbed with Betadine scrub brush.  Some dead skin was peeled off.  DuraPrep was applied up to the level of the proximal thigh tourniquet in the usual extremity sheets and drapes were applied, procedure preoperative Ancef prophylaxis 2 g.  A sterile skin marker was used incision was made along the anterior lateral tibial crest extending distally close to the midline.  The lateral cortex of the tibia was exposed down to the nonunion where there was obvious mobile motion.  Nonunion was taken down to wire was used to drill multiple holes in the ends of the bones for revascularization.  Large bone-holding clamps were applied continue distraction takedown of the nonunion.  A sling saw was used to freshen the edges using  lateral butterfly fragment was left separate.  Oscillating saw with irrigation followed the oblique fracture line allowed bone to bone compression and C-arm was draped sterilely brought in and adjustment was made until the lateral cortex was straight plate was applied and all plate adjusted up and down checked AP and lateral on the fluoroscopy until there was good alignment of the fracture nonunion.  A single K wire had been drilled to hold the fracture.  Proximal 5 mm compression screws were placed in neutral position and then the first slot hole for compression distal was used to compress the fracture.  Cortical fixation of all screws were placed in palpation of the screw just penetrated to the medial cortex was performed.  The distal  most screw was a little bit long it was exchanged for a 2 mm shorter screw.  Screws were 22-36 mm in length.  Pieces of bone that had been removed were carefully packed posterior medially.  This was the patient's own bone that had been removed.  It was cut into small pieces with a rondure meticulously packed.  Final spot pictures were taken irrigation tourniquet deflation operative field was dry.  Hemovac drain was placed exiting proximally.  2-0 Vicryl reapproximated fascia periosteum subcutaneous tissue.  2-0 nylon sutures were used for interrupted skin closure Xeroform 4 x 4's ABD Webril and Ace wrap followed by cam boot reapplication.  Pulses were intact.  Patient tolerated the procedure well.  Patient's son who is power of attorney was called after the procedure pictures were sent to him on his phone AP and lateral the fixation after the surgery.

## 2017-01-09 ENCOUNTER — Encounter (INDEPENDENT_AMBULATORY_CARE_PROVIDER_SITE_OTHER): Payer: Self-pay | Admitting: Orthopaedic Surgery

## 2017-01-09 DIAGNOSIS — L8990 Pressure ulcer of unspecified site, unspecified stage: Secondary | ICD-10-CM

## 2017-01-09 LAB — BASIC METABOLIC PANEL
Anion gap: 7 (ref 5–15)
BUN: 19 mg/dL (ref 6–20)
CHLORIDE: 104 mmol/L (ref 101–111)
CO2: 25 mmol/L (ref 22–32)
Calcium: 8.7 mg/dL — ABNORMAL LOW (ref 8.9–10.3)
Creatinine, Ser: 0.89 mg/dL (ref 0.44–1.00)
GFR calc Af Amer: >60 mL/min (ref >60)
GFR calc non Af Amer: >60 mL/min (ref >60)
Glucose, Bld: 109 mg/dL — ABNORMAL HIGH (ref 65–99)
POTASSIUM: 5.1 mmol/L (ref 3.5–5.1)
SODIUM: 136 mmol/L (ref 135–145)

## 2017-01-09 NOTE — Evaluation (Signed)
Physical Therapy Evaluation Patient Details Name: Ariana Patton MRN: [id number] DOB: 03/23/1945 Today's Date: 01/09/2017   History of Present Illness  In August, patient fell at her nursing home and suffered a tib fib mid shaft fx. She had an external fixation performed on 10/13/2016. 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 PMH includes multiple scleorosis; dementia (per daughter but not in chart) depression.   Clinical Impression  Pt admitted with above diagnosis. Pt currently with functional limitations due to the deficits listed below (see PT Problem List). At the time of PT eval pt was able to transition bed to chair with +2 mod assist for balance support and safety. Pt with overall good rehab effort and was able to maintain NWB throughout most of the transfer. Pt was educated on weight bearing status and general safety with mobility. Attempted with RW initially, however feel a slide board may be safer despite pt's fear of falling while using a slideboard. Feel with increased education of safety with slideboard she will do well with it. Pt will benefit from skilled PT to increase their independence and safety with mobility to allow discharge to the venue listed below.       Follow Up Recommendations SNF;Supervision/Assistance - 24 hour    Equipment Recommendations  Wheelchair (measurements PT);Wheelchair cushion (measurements PT)    Recommendations for Other Services       Precautions / Restrictions Precautions Precautions: Fall Required Braces or Orthoses: Other Brace/Splint Other Brace/Splint: R boot Restrictions Weight Bearing Restrictions: Yes RLE Weight Bearing: Non weight bearing      Mobility  Bed Mobility Overal bed mobility: Needs Assistance Bed Mobility: Supine to Sit     Supine to sit: Min assist     General bed mobility comments: Heavy use of rails, but good initiation. Pt was able to transition to EOB with assist to complete  scooting fully to EOB.   Transfers Overall transfer level: Needs assistance Equipment used: Rolling walker (2 wheeled);2 person hand held assist Transfers: Sit to/from UGI CorporationStand;Stand Pivot Transfers Sit to Stand: Mod assist;+2 physical assistance Stand pivot transfers: Mod assist;+2 physical assistance       General transfer comment: Attempted initially with the RW, and pt was not able to gain standing balance (significant posterior lean, however kept RLE NWB fairly well). Then attempted 2-person SPT to chair, and pt was able to keep RLE off the ground throughout transfer.  Ambulation/Gait             General Gait Details: Not attempted  Stairs            Wheelchair Mobility    Modified Rankin (Stroke Patients Only)       Balance Overall balance assessment: Needs assistance Sitting-balance support: Feet supported;No upper extremity supported Sitting balance-Leahy Scale: Fair     Standing balance support: Bilateral upper extremity supported;During functional activity Standing balance-Leahy Scale: Zero Standing balance comment: +2 to maintain standing balance                             Pertinent Vitals/Pain Pain Assessment: Faces Faces Pain Scale: Hurts even more Pain Location: RLE Pain Descriptors / Indicators: Operative site guarding Pain Intervention(s): Monitored during session;Limited activity within patient's tolerance;Repositioned    Home Living Family/patient expects to be discharged to:: Skilled nursing facility                      Prior Function Level  of Independence: Needs assistance         Comments: Pt reports staff was assisting with ADL's and mobility with slideboard, however she does not like the slideboard because it "gets away from her sometimes".      Hand Dominance        Extremity/Trunk Assessment   Upper Extremity Assessment Upper Extremity Assessment: Defer to OT evaluation    Lower Extremity  Assessment Lower Extremity Assessment: Generalized weakness;RLE deficits/detail RLE Deficits / Details: Decreased strength and AROM consistent with above mentioned procedure. Pt able to wiggle her toes and reports normal sensation in toes.  RLE: Unable to fully assess due to immobilization    Cervical / Trunk Assessment Cervical / Trunk Assessment: Kyphotic  Communication   Communication: No difficulties  Cognition Arousal/Alertness: Awake/alert Behavior During Therapy: WFL for tasks assessed/performed Overall Cognitive Status: Within Functional Limits for tasks assessed                                        General Comments      Exercises     Assessment/Plan    PT Assessment Patient needs continued PT services  PT Problem List Decreased strength;Decreased range of motion;Decreased activity tolerance;Decreased balance;Decreased mobility;Decreased knowledge of use of DME;Decreased safety awareness;Decreased knowledge of precautions;Pain       PT Treatment Interventions DME instruction;Gait training;Stair training;Functional mobility training;Therapeutic activities;Therapeutic exercise;Neuromuscular re-education;Patient/family education    PT Goals (Current goals can be found in the Care Plan section)  Acute Rehab PT Goals Patient Stated Goal: Heal her leg PT Goal Formulation: With patient Time For Goal Achievement: 01/16/17 Potential to Achieve Goals: Good    Frequency Min 3X/week   Barriers to discharge        Co-evaluation               AM-PAC PT "6 Clicks" Daily Activity  Outcome Measure Difficulty turning over in bed (including adjusting bedclothes, sheets and blankets)?: Unable Difficulty moving from lying on back to sitting on the side of the bed? : Unable Difficulty sitting down on and standing up from a chair with arms (e.g., wheelchair, bedside commode, etc,.)?: Unable Help needed moving to and from a bed to chair (including a  wheelchair)?: Total Help needed walking in hospital room?: Total Help needed climbing 3-5 steps with a railing? : Total 6 Click Score: 6    End of Session Equipment Utilized During Treatment: Gait belt Activity Tolerance: Patient tolerated treatment well Patient left: in chair;with call bell/phone within reach;with nursing/sitter in room Nurse Communication: Mobility status PT Visit Diagnosis: Unsteadiness on feet (R26.81);Difficulty in walking, not elsewhere classified (R26.2);History of falling (Z91.81);Muscle weakness (generalized) (M62.81)    Time: 1115-1140 PT Time Calculation (min) (ACUTE ONLY): 25 min   Charges:   PT Evaluation $PT Eval Moderate Complexity: 1 Mod PT Treatments $Gait Training: 8-22 mins   PT G Codes:        Conni Slipper, PT, DPT Acute Rehabilitation Services Pager: [phone]   Marylynn Pearson 01/09/2017, 11:50 AM

## 2017-01-09 NOTE — Social Work (Signed)
Pt will need PT evaluation for short term rehab.  FL2 pending evaluation recommendations.  Pt will return to [facility].  Keene Breath, LCSW Clinical Social Worker [phone]

## 2017-01-09 NOTE — Clinical Social Work Note (Signed)
Clinical Social Work Assessment  Patient Details  Name: Ariana Patton MRN: [id number] Date of Birth: 1945/07/22  Date of referral:  01/09/17               Reason for consult:  Facility Placement                Permission sought to share information with:  Oceanographer granted to share information::  Yes, Verbal Permission Granted  Name::     Ariana Patton  Agency::  [facility]  Relationship::  son  Contact Information:     Housing/Transportation Living arrangements for the past 2 months:  Skilled Building surveyor of Information:  Patient Patient Interpreter Needed:  None Criminal Activity/Legal Involvement Pertinent to Current Situation/Hospitalization:  No - Comment as needed Significant Relationships:  Adult Children Lives with:  Facility Resident Do you feel safe going back to the place where you live?  Yes Need for family participation in patient care:  Yes (Comment)  Care giving concerns:  Pt from [facility] previously for short term rehab. Pt returned to hospital for a repair and hardware removal. Pt will return to SNF to continue short term rehab. CSW confirmed with patient, son Ariana Patton and Ariana Patton in admissions.   Social Worker assessment / plan:  CSW will assist with disposition back to SNF at appropriate time.  Employment status:  Retired Health and safety inspector:  Medicare PT Recommendations:  Skilled Nursing Facility Information / Referral to community resources:  Skilled Nursing Facility  Patient/Family's Response to care:  Patient and son appreciative of CSW assistance. No issues or concerns identified.  Patient/Family's Understanding of and Emotional Response to Diagnosis, Current Treatment, and Prognosis:  Patient and son has good understanding of diagnosis, current treatment and prognosis. Plan is to return to [facility] for short term rehab. Pt's long term goal will be to get into [facility] and family has started that  process. No issues or concerns at this time.  Emotional Assessment Appearance:  Appears stated age Attitude/Demeanor/Rapport:  (Cooperative) Affect (typically observed):  Accepting, Appropriate Orientation:  Oriented to Situation, Oriented to  Time, Oriented to Place, Oriented to Self Alcohol / Substance use:  Not Applicable Psych involvement (Current and /or in the community):  No (Comment)  Discharge Needs  Concerns to be addressed:  Care Coordination Readmission within the last 30 days:  Yes Current discharge risk:  Physical Impairment, Dependent with Mobility Barriers to Discharge:  No Barriers Identified   [name], LCSW 01/09/2017, 10:44 AM

## 2017-01-09 NOTE — Progress Notes (Signed)
OT Cancellation Note  Patient Details Name: Ariana Patton MRN: [id number] DOB: [birth year]   Cancelled Treatment:    Reason Eval/Treat Not Completed: Pain limiting ability to participate; Pt currently reporting increased pain levels in RLE (RN aware and bringing meds), politely requesting OT return at a later time. Will follow up as schedule permits.   Marcy Siren, OT Pager [phone] 01/09/2017   [name] 01/09/2017, 4:53 PM

## 2017-01-09 NOTE — Progress Notes (Signed)
   Subjective: 1 Day Post-Op Procedure(s) (LRB): OPEN REDUCTION INTERNAL FIXATION (ORIF) RIGHT TIBIA FRACTURE, COMPRESSION PLATE (Right) Patient reports pain as mild.    Objective: Vital signs in last 24 hours: Temp:  [97.7 F (36.5 C)-98.3 F (36.8 C)] 98.1 F (36.7 C) (11/13 0415) Pulse Rate:  [77-89] 77 (11/13 0415) Resp:  [12-17] 12 (11/13 0415) BP: (90-123)/(48-77) 90/48 (11/13 0415) SpO2:  [95 %-100 %] 98 % (11/13 0415) Weight:  [135 lb (61.2 kg)] 135 lb (61.2 kg) (11/12 1326)  Intake/Output from previous day: 11/12 0701 - 11/13 0700 In: 1260 [P.O.:360; I.V.:900] Out: 151 [Urine:1; Drains:120; Blood:30] Intake/Output this shift: No intake/output data recorded.  Recent Labs    01/08/17 1320  HGB 13.3   Recent Labs    01/08/17 1320  WBC 7.4  RBC 4.48  HCT 40.6  PLT 344   Recent Labs    01/08/17 1320 01/09/17 0557  NA 138 136  K 4.7 5.1  CL 105 104  CO2 25 25  BUN 12 19  CREATININE 0.61 0.89  GLUCOSE 94 109*  CALCIUM 9.3 8.7*   No results for input(s): LABPT, INR in the last 72 hours.  Neurologically intact Dg Tibia/fibula Right  Result Date: 01/08/2017 CLINICAL DATA:  Right leg fracture EXAM: DG C-ARM 61-120 MIN; RIGHT TIBIA AND FIBULA - 2 VIEW COMPARISON:  12/29/2016, 10/14/2006 FINDINGS: Total fluoroscopy time was 15 seconds. Two low resolution intraoperative spot views of the right tibia and fibula. The images demonstrate surgical plate and multiple screw fixation of the mid to distal shaft of the tibia across a fracture. Also visible is 8 displaced and overriding distal fibular fracture. IMPRESSION: Intraoperative fluoroscopic assistance provided during surgical fixation of tibial fracture Electronically Signed   By: Jasmine Pang M.D.   On: 01/08/2017 16:56   Dg C-arm 1-60 Min  Result Date: 01/08/2017 CLINICAL DATA:  Right leg fracture EXAM: DG C-ARM 61-120 MIN; RIGHT TIBIA AND FIBULA - 2 VIEW COMPARISON:  12/29/2016, 10/14/2006 FINDINGS: Total  fluoroscopy time was 15 seconds. Two low resolution intraoperative spot views of the right tibia and fibula. The images demonstrate surgical plate and multiple screw fixation of the mid to distal shaft of the tibia across a fracture. Also visible is 8 displaced and overriding distal fibular fracture. IMPRESSION: Intraoperative fluoroscopic assistance provided during surgical fixation of tibial fracture Electronically Signed   By: Jasmine Pang M.D.   On: 01/08/2017 16:56    Assessment/Plan: 1 Day Post-Op Procedure(s) (LRB): OPEN REDUCTION INTERNAL FIXATION (ORIF) RIGHT TIBIA FRACTURE, COMPRESSION PLATE (Right) Up with therapy OOB to recliner NWB.  OK for SNF return tomorrow.   Eldred Manges 01/09/2017, 7:51 AM

## 2017-01-10 ENCOUNTER — Encounter (HOSPITAL_COMMUNITY): Payer: Self-pay | Admitting: Orthopaedic Surgery

## 2017-01-10 LAB — BASIC METABOLIC PANEL
ANION GAP: 5 (ref 5–15)
BUN: 12 mg/dL (ref 6–20)
CALCIUM: 8.6 mg/dL — AB (ref 8.9–10.3)
CO2: 26 mmol/L (ref 22–32)
Chloride: 109 mmol/L (ref 101–111)
Creatinine, Ser: 0.67 mg/dL (ref 0.44–1.00)
Glucose, Bld: 82 mg/dL (ref 65–99)
Potassium: 3.7 mmol/L (ref 3.5–5.1)
Sodium: 140 mmol/L (ref 135–145)

## 2017-01-10 MED ORDER — BISACODYL 10 MG RE SUPP: 10.0000 mg | Freq: Once | RECTAL | Status: DC

## 2017-01-10 MED ORDER — HYDROCODONE-ACETAMINOPHEN 10-325 MG PO TABS: 1.0000 | ORAL_TABLET | Freq: Four times a day (QID) | ORAL | 0 refills | Status: AC | PRN

## 2017-01-10 NOTE — Discharge Instructions (Signed)
Orthopedic discharge instructions  -Strict nonweightbearing right lower extremity -Okay to shower but no tub soaking. Do not apply any creams or ointments to incision. Staples possibly will be removed at 2 week postop appointment in our office -Elevate right foot above heart level as much as possible to help decrease swelling and pain. -RN to do daily wound checks and dressing changes.  Okay to use 4 x 4 gauze and tape. -Cam boot must be on at all times.

## 2017-01-10 NOTE — Progress Notes (Signed)
Subjective: Patient doing well. Good pain control. Waiting for transfer back to [facility].   Objective: Vital signs in last 24 hours: Temp:  [98 F (36.7 C)-98.1 F (36.7 C)] 98 F (36.7 C) (11/14 1300) Pulse Rate:  [75-87] 87 (11/14 1300) Resp:  [16] 16 (11/14 1300) BP: (98-102)/(45-65) 100/54 (11/14 1300) SpO2:  [94 %] 94 % (11/14 1300)  Intake/Output from previous day: 11/13 0701 - 11/14 0700 In: 1938.6 [P.O.:960; I.V.:978.6] Out: 200 [Urine:200] Intake/Output this shift: Total I/O In: 360 [P.O.:360] Out: -   Recent Labs    01/08/17 1320  HGB 13.3   Recent Labs    01/08/17 1320  WBC 7.4  RBC 4.48  HCT 40.6  PLT 344   Recent Labs    01/09/17 0557 01/10/17 0421  NA 136 140  K 5.1 3.7  CL 104 109  CO2 25 26  BUN 19 12  CREATININE 0.89 0.67  GLUCOSE 109* 82  CALCIUM 8.7* 8.6*   No results for input(s): LABPT, INR in the last 72 hours.  Exam Dressing clean dry and intact. Neurovascularly intact. Calf Nontender.  Assessment/Plan: We'll plan on transferring the patient back [facility] tomorrow. Continue nonweightbearing restrictions right lower extremity.   Zonia Kief 01/10/2017, 3:12 PM

## 2017-01-10 NOTE — Evaluation (Signed)
Occupational Therapy Evaluation Patient Details Name: Ariana BarConstance Auletta MRN: [id number] DOB: 10/20/1945 Today's Date: 01/10/2017    History of Present Illness Patient fell at her nursing home and suffered a tib fib mid shaft fx. She had an external fixation performed on 10/13/2016. 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 PMH includes multiple scleorosis; dementia (per daughter but not in chart) depression,    Clinical Impression   Pt with decline in function and safety with ADLs and ADL mobility. [facility] and was receiving assist with ADLs and transfers. No further acute OT is indicated at this time, further therapy intervention is deferred to SNF where pt would benefit from further OT    Follow Up Recommendations  SNF;Supervision/Assistance - 24 hour    Equipment Recommendations  None recommended by OT    Recommendations for Other Services       Precautions / Restrictions Precautions Precautions: Fall Required Braces or Orthoses: Other Brace/Splint Other Brace/Splint: R boot Restrictions Weight Bearing Restrictions: Yes RLE Weight Bearing: Non weight bearing      Mobility Bed Mobility Overal bed mobility: Needs Assistance Bed Mobility: Supine to Sit     Supine to sit: Min assist     General bed mobility comments: pt OOB in recliner upon arrival  Transfers Overall transfer level: Needs assistance Equipment used: 2 person hand held assist;Rolling walker (2 wheeled) Transfers: Sit to/from [employer]Stand;Stand Pivot Transfers Sit to Stand: Mod assist;+2 physical assistance Stand pivot transfers: Mod assist;+2 physical assistance      Lateral/Scoot Transfers: Mod assist;Max assist;+2 physical assistance;From elevated surface General transfer comment: attempted x 4 up from recliner    Balance Overall balance assessment: Needs assistance Sitting-balance support: Feet supported;No upper extremity supported Sitting balance-Leahy Scale:  Fair       Standing balance-Leahy Scale: Zero                             ADL either performed or assessed with clinical judgement   ADL                                         General ADL Comments: [facility] and was receiving assist with ADLs and transfers from staff. Pt currently requires min A with UB and max - total A with LB ADLs     Vision Baseline Vision/History: Wears glasses Wears Glasses: Reading only Patient Visual Report: No change from baseline       Perception     Praxis      Pertinent Vitals/Pain Pain Assessment: 0-10 Pain Score: 3  Faces Pain Scale: Hurts even more Pain Location: RLE Pain Descriptors / Indicators: Operative site guarding Pain Intervention(s): Limited activity within patient's tolerance;Premedicated before session;Monitored during session;Relaxation;Repositioned     Hand Dominance Right   Extremity/Trunk Assessment Upper Extremity Assessment Upper Extremity Assessment: Generalized weakness   Lower Extremity Assessment Lower Extremity Assessment: Defer to PT evaluation   Cervical / Trunk Assessment Cervical / Trunk Assessment: Kyphotic   Communication Communication Communication: No difficulties   Cognition Arousal/Alertness: Awake/alert Behavior During Therapy: WFL for tasks assessed/performed Overall Cognitive Status: Within Functional Limits for tasks assessed  General Comments   pt pleasant and cooperative    Exercises     Shoulder Instructions      Home Living Family/patient expects to be discharged to:: Skilled nursing facility                                 Additional Comments: Patient has been living at a SNF per patient because of MS flair up       Prior Functioning/Environment Level of Independence: Needs assistance        Comments: Pt reports staff was assisting with ADL's and mobility with  slideboard, however she does not like the slideboard because it "gets away from her sometimes".         OT Problem List: Decreased strength;Decreased activity tolerance;Decreased knowledge of use of DME or AE;Impaired balance (sitting and/or standing);Pain      OT Treatment/Interventions:      OT Goals(Current goals can be found in the care plan section) Acute Rehab OT Goals Patient Stated Goal: Heal her leg OT Goal Formulation: With patient Potential to Achieve Goals: Good  OT Frequency:     Barriers to D/C:    pt will return to SNF for rehab       Co-evaluation              AM-PAC PT "6 Clicks" Daily Activity     Outcome Measure Help from another person eating meals?: None Help from another person taking care of personal grooming?: A Little Help from another person toileting, which includes using toliet, bedpan, or urinal?: Total Help from another person bathing (including washing, rinsing, drying)?: A Lot Help from another person to put on and taking off regular upper body clothing?: A Little Help from another person to put on and taking off regular lower body clothing?: Total 6 Click Score: 14   End of Session Equipment Utilized During Treatment: Rolling walker  Activity Tolerance: Patient limited by fatigue Patient left: in chair;with call bell/phone within reach  OT Visit Diagnosis: Unsteadiness on feet (R26.81);History of falling (Z91.81);Muscle weakness (generalized) (M62.81);Other symptoms and signs involving the nervous system (R29.898);Pain Pain - Right/Left: Right Pain - part of body: Leg                Time: 1055-1120 OT Time Calculation (min): 25 min Charges:  OT General Charges $OT Visit: 1 Visit OT Evaluation $OT Eval Moderate Complexity: 1 Mod OT Treatments $Therapeutic Activity: 8-22 mins G-Codes: OT G-codes **NOT FOR INPATIENT CLASS** Functional Assessment Tool Used: AM-PAC 6 Clicks Daily Activity     Galen Manila 01/10/2017,  12:48 PM

## 2017-01-10 NOTE — Progress Notes (Signed)
Physical Therapy Treatment Patient Details Name: Ariana Patton MRN: [id number] DOB: 05/15/1945 Today's Date: 01/10/2017    History of Present Illness Patient fell at her nursing home and suffered a tib fib mid shaft fx. She had an external fixation performed on 10/13/2016. 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 PMH includes multiple scleorosis; dementia (per daughter but not in chart) depression,     PT Comments    Pt progressing towards physical therapy goals. Focus of session was to assess lateral transfers to promote independence with bed<>chair. Pt demonstrated difficulty maintaining an anterior lean and mobilizing hips. Required +2 mod-max to complete scoot transfer to drop-arm recliner. Pt anticipates d/c back to SNF today. Will continue to follow and progress as able per POC.    Follow Up Recommendations  SNF;Supervision/Assistance - 24 hour     Equipment Recommendations  Wheelchair (measurements PT);Wheelchair cushion (measurements PT)    Recommendations for Other Services       Precautions / Restrictions Precautions Precautions: Fall Required Braces or Orthoses: Other Brace/Splint Other Brace/Splint: R boot Restrictions Weight Bearing Restrictions: Yes RLE Weight Bearing: Non weight bearing    Mobility  Bed Mobility Overal bed mobility: Needs Assistance Bed Mobility: Supine to Sit     Supine to sit: Min assist     General bed mobility comments: Heavy use of rails, but good initiation. Pt was able to transition to EOB with assist to complete scooting fully to EOB.   Transfers Overall transfer level: Needs assistance Equipment used: 2 person hand held assist Transfers: Lateral/Scoot Transfers          Lateral/Scoot Transfers: Mod assist;Max assist;+2 physical assistance;From elevated surface General transfer comment: Attempted lateral transfer to drop-arm recliner to promote independence with transfers. Pt with increased  difficulty scooting, and required +2 mod-max assist for full transition to chair. Noted significant posterior lean throughout attempts to scoot laterally causing hips to migrate anteriorly.   Ambulation/Gait             General Gait Details: Not attempted   Stairs            Wheelchair Mobility    Modified Rankin (Stroke Patients Only)       Balance Overall balance assessment: Needs assistance Sitting-balance support: Feet supported;No upper extremity supported Sitting balance-Leahy Scale: Fair                                      Cognition Arousal/Alertness: Awake/alert Behavior During Therapy: WFL for tasks assessed/performed Overall Cognitive Status: Within Functional Limits for tasks assessed                                        Exercises      General Comments        Pertinent Vitals/Pain Pain Assessment: Faces Faces Pain Scale: Hurts even more Pain Location: RLE Pain Descriptors / Indicators: Operative site guarding Pain Intervention(s): Limited activity within patient's tolerance;Monitored during session;Repositioned    Home Living                      Prior Function            PT Goals (current goals can now be found in the care plan section) Acute Rehab PT Goals Patient Stated Goal: Heal her leg  PT Goal Formulation: With patient Time For Goal Achievement: 01/16/17 Potential to Achieve Goals: Good Progress towards PT goals: Progressing toward goals    Frequency    Min 3X/week      PT Plan Current plan remains appropriate    Co-evaluation              AM-PAC PT "6 Clicks" Daily Activity  Outcome Measure  Difficulty turning over in bed (including adjusting bedclothes, sheets and blankets)?: Unable Difficulty moving from lying on back to sitting on the side of the bed? : Unable Difficulty sitting down on and standing up from a chair with arms (e.g., wheelchair, bedside commode,  etc,.)?: Unable Help needed moving to and from a bed to chair (including a wheelchair)?: Total Help needed walking in hospital room?: Total Help needed climbing 3-5 steps with a railing? : Total 6 Click Score: 6    End of Session Equipment Utilized During Treatment: Gait belt Activity Tolerance: Patient tolerated treatment well Patient left: in chair;with call bell/phone within reach;with nursing/sitter in room Nurse Communication: Mobility status PT Visit Diagnosis: Unsteadiness on feet (R26.81);Difficulty in walking, not elsewhere classified (R26.2);History of falling (Z91.81);Muscle weakness (generalized) (M62.81)     Time: 8937-3428 PT Time Calculation (min) (ACUTE ONLY): 22 min  Charges:  $Therapeutic Activity: 8-22 mins                    G Codes:       Conni Slipper, PT, DPT Acute Rehabilitation Services Pager: [phone]    Marylynn Pearson 01/10/2017, 10:22 AM

## 2017-01-11 LAB — BASIC METABOLIC PANEL
ANION GAP: 8 (ref 5–15)
BUN: 8 mg/dL (ref 6–20)
CHLORIDE: 106 mmol/L (ref 101–111)
CO2: 24 mmol/L (ref 22–32)
CREATININE: 0.6 mg/dL (ref 0.44–1.00)
Calcium: 8.5 mg/dL — ABNORMAL LOW (ref 8.9–10.3)
GFR calc non Af Amer: >60 mL/min (ref >60)
Glucose, Bld: 76 mg/dL (ref 65–99)
POTASSIUM: 3.6 mmol/L (ref 3.5–5.1)
SODIUM: 138 mmol/L (ref 135–145)

## 2017-01-11 NOTE — Social Work (Signed)
Pt will return to [facility] when [facility].  CSW will f/u for disposition.  Keene Breath, LCSW Clinical Social Worker [phone]

## 2017-01-11 NOTE — Social Work (Signed)
Clinical Social Worker facilitated patient discharge including contacting patient family and facility to confirm patient discharge plans.  Clinical information faxed to facility and family agreeable with plan.    CSW arranged ambulance transport via [facility] to [facility].    RN to call [phone] to give report prior to discharge.  Pt going to Room 211B.  Clinical Social Worker will sign off for now as social work intervention is no longer needed. Please consult [place] again if new need arises.  Keene Breath, LCSW Clinical Social Worker [phone]

## 2017-01-11 NOTE — NC FL2 (Signed)
[place] MEDICAID FL2 LEVEL OF CARE SCREENING TOOL     IDENTIFICATION  Patient Name: Ariana Patton Birthdate: 05/27/1945 Sex: female Admission Date (Current Location): 01/08/2017  [facility] [place]   Facility and Address:  [facility], [address], [place], [place] [address]      Provider Number: [id number]  Attending Physician Name and Address:  Eldred MangesYates, Mark C, MD  Relative Name and Phone Number:  Gracy Bruinsrick Hukill, [phone] son    Current Level of Care: Hospital Recommended Level of Care: Skilled Nursing Facility Prior Approval Number:    Date Approved/Denied:   PASRR Number: [id number]  Discharge Plan: SNF    Current Diagnoses: Patient Active Problem List   Diagnosis Date Noted  . Pressure injury of skin 01/09/2017  . 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 Open fracture of right tibia and fibula 10/14/2016    Orientation RESPIRATION BLADDER Height & Weight     Self, Time, Situation, Place  O2(Nasal Cannula 2L) Incontinent Weight: 135 lb (61.2 kg) Height:     BEHAVIORAL SYMPTOMS/MOOD NEUROLOGICAL BOWEL NUTRITION STATUS      Continent Diet(See DC summary)  AMBULATORY STATUS COMMUNICATION OF NEEDS Skin   Extensive Assist Verbally Surgical wounds, PU Stage and Appropriate Care PU Stage 1 Dressing: No Dressing                     Personal Care Assistance Level of Assistance  Bathing, Feeding, Dressing Bathing Assistance: Maximum assistance Feeding assistance: Independent Dressing Assistance: Maximum assistance     Functional Limitations Info  Sight, Hearing, Speech Sight Info: Adequate Hearing Info: Adequate Speech Info: Adequate    SPECIAL CARE FACTORS FREQUENCY  PT (By licensed PT), OT (By licensed OT)     PT Frequency: 3x week OT Frequency: 3x week            Contractures Contractures Info: Not present    Additional Factors Info  Code Status,  Allergies, Psychotropic Code Status Info: Full code Allergies Info: Penicillins Psychotropic Info: Wellbutrin         Current Medications (01/11/2017):  This is the current hospital active medication list Current Facility-Administered Medications  Medication Dose Route Frequency Provider Last Rate Last Dose  . 0.9 %  sodium chloride infusion   Intravenous Continuous Naida SleightOwens, James M, PA-C 85 mL/hr at 01/09/17 0534    . acetaminophen (TYLENOL) tablet 650 mg  650 mg Oral Q4H PRN Naida Sleightwens, James M, PA-C       Or  . acetaminophen (TYLENOL) suppository 650 mg  650 mg Rectal Q4H PRN Naida Sleightwens, James M, PA-C      . bisacodyl (DULCOLAX) suppository 10 mg  10 mg Rectal Once Naida SleightOwens, James M, PA-C      . buPROPion [facility](WELLBUTRIN SR) 12 hr tablet 150 mg  150 mg Oral Q12H Naida SleightOwens, James M, PA-C   150 mg at 01/11/17 [id number]  . cholestyramine light (PREVALITE) packet 4 g  4 g Oral BID Naida SleightOwens, James M, PA-C   4 g at 01/11/17 [id number]  . docusate sodium (COLACE) capsule 100 mg  100 mg Oral BID Naida SleightOwens, James M, PA-C   100 mg at 01/11/17 [id number]  . fluticasone (FLONASE) 50 MCG/ACT nasal spray 1 spray  1 spray Each Nare BID Naida SleightOwens, James M, PA-C   1 spray at 01/11/17 [id number]  . gabapentin (NEURONTIN) capsule 300 mg  300 mg Oral Q8H Naida Sleightwens, James M, [place]  300 mg at 01/11/17 0657  . HYDROcodone-acetaminophen (NORCO) 10-325 MG per tablet 1 tablet  1 tablet Oral Q6H PRN Naida Sleight, PA-C   1 tablet at 01/11/17 0302  . lactated ringers infusion   Intravenous Continuous Massagee, Terry, MD      . meclizine (ANTIVERT) tablet 25 mg  25 mg Oral Q8H PRN Zonia Kief M, PA-C      . metoCLOPramide (REGLAN) tablet 5-10 mg  5-10 mg Oral Q8H PRN Naida Sleight, PA-C      . mirabegron ER [facility]) tablet 25 mg  25 mg Oral Daily Naida Sleight, PA-C   25 mg at 01/11/17 2446  . ondansetron (ZOFRAN) tablet 4 mg  4 mg Oral Q6H PRN Naida Sleight, PA-C      . pantoprazole (PROTONIX) EC tablet 40 mg  40 mg Oral Daily Naida Sleight, PA-C   40 mg at 01/11/17  2863  . polyethylene glycol (MIRALAX / GLYCOLAX) packet 17 g  17 g Oral Daily PRN Naida Sleight, PA-C      . [START ON 01/15/2017] Vitamin D (Ergocalciferol) (DRISDOL) capsule 50,000 Units  50,000 Units Oral Q Mon Naida Sleight, [place]         Discharge Medications: Please see discharge summary for a list of discharge medications.  Relevant Imaging Results:  Relevant Lab Results:   Additional Information SS#:[id number] LCSW

## 2017-01-11 NOTE — Discharge Summary (Signed)
Patient ID: Ariana BarConstance Ozimek MRN: [id number] DOB/AGE: 72/09/1945 71 y.o.  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 Depression   . Dizziness   . GERD (gastroesophageal reflux disease)   . MS (multiple sclerosis) (HCC)   . Multiple sclerosis (HCC)   . Vitamin D deficiency     Surgeries: Procedure(s): OPEN REDUCTION INTERNAL FIXATION (ORIF) RIGHT TIBIA FRACTURE, COMPRESSION PLATE on 21/30/865711/01/2017   Consultants:   Discharged Condition: Improved  Hospital Course: Ariana Patton is an 71 y.o. female who was admitted 01/08/2017 for operative treatment of tibia nonunion. Patient failed conservative treatments (please see the history and physical for the specifics) and had severe unremitting pain that affects sleep, daily activities and work/hobbies. After pre-op clearance, the patient was taken to the operating room on 01/08/2017 and underwent  Procedure(s): OPEN REDUCTION INTERNAL FIXATION (ORIF) RIGHT TIBIA FRACTURE, COMPRESSION PLATE.    Patient was given perioperative antibiotics:  Anti-infectives (From admission, onward)   Start     Dose/Rate Route Frequency Ordered Stop   01/09/17 0600  ceFAZolin (ANCEF) IVPB 2g/100 mL premix     2 g 200 mL/hr over 30 Minutes Intravenous On call to O.R. 01/08/17 1319 01/08/17 1542   01/08/17 2000  vancomycin (VANCOCIN) IVPB 1000 mg/200 mL premix     1,000 mg 200 mL/hr over 60 Minutes Intravenous Every 12 hours 01/08/17 1950 01/08/17 2300   01/08/17 1326  ceFAZolin (ANCEF) 2-4 GM/100ML-% IVPB    Comments:  Lorenda IshiharaGibbs,  Bonnie   : cabinet override      01/08/17 1326 01/08/17 1542       Patient was given sequential compression devices and early ambulation to prevent DVT.   Patient benefited maximally from hospital stay and there were no complications. At the time of discharge, the patient was urinating/moving their bowels without difficulty, tolerating a regular diet, pain is controlled with oral pain medications and they have been cleared by PT/OT.   Recent vital signs:  Patient Vitals for the past 24 hrs:  BP Temp Temp src Pulse Resp SpO2  01/11/17 1228 (!) 100/58 98.1 F (36.7 C) Oral 77 18 95 %  01/11/17 1033 (!) 95/52 98.7 F (37.1 C) Oral 84 16 95 %  01/11/17 0700 108/69 98.2 F (36.8 C) Oral 84 16 96 %  01/10/17 2226 (!) 110/47 98.1 F (36.7 C) Oral 80 16 97 %     Recent laboratory studies:  Recent Labs    01/10/17 0421 01/11/17 0638  NA 140 138  K 3.7 3.6  CL 109 106  CO2 26 24  BUN 12 8  CREATININE 0.67 0.60  GLUCOSE 82 76  CALCIUM 8.6* 8.5*     Discharge Medications:     Diagnostic Studies: Dg Tibia/fibula Right  Result Date: 01/08/2017 CLINICAL DATA:  Right leg fracture EXAM: DG C-ARM 61-120 MIN; RIGHT TIBIA AND FIBULA - 2 VIEW COMPARISON:  12/29/2016, 10/14/2006 FINDINGS: Total fluoroscopy time was 15 seconds. Two low resolution intraoperative spot views of the right tibia and fibula. The images demonstrate surgical plate and multiple screw fixation of the mid  to distal shaft of the tibia across a fracture. Also visible is 8 displaced and overriding distal fibular fracture. IMPRESSION: Intraoperative fluoroscopic assistance provided during surgical fixation of tibial fracture Electronically Signed   By: Jasmine Pang M.D.   On: 01/08/2017 16:56   Dg C-arm 1-60 Min  Result Date: 01/08/2017 CLINICAL DATA:  Right leg fracture EXAM: DG C-ARM 61-120 MIN; RIGHT TIBIA AND FIBULA - 2 VIEW COMPARISON:  12/29/2016, 10/14/2006 FINDINGS: Total fluoroscopy time was 15 seconds. Two low  resolution intraoperative spot views of the right tibia and fibula. The images demonstrate surgical plate and multiple screw fixation of the mid to distal shaft of the tibia across a fracture. Also visible is 8 displaced and overriding distal fibular fracture. IMPRESSION: Intraoperative fluoroscopic assistance provided during surgical fixation of tibial fracture Electronically Signed   By: Jasmine Pang M.D.   On: 01/08/2017 16:56   Xr Tibia/fibula Right  Result Date: 01/09/2017 The lateral right tib-fib x-rays demonstrate nonunion without callus formation.  There is angulation and slight displacement unchanged from previous x-ray. Impression right tib-fib nonunion     Follow-up Information    Eldred Manges, MD. Schedule an appointment as soon as possible for a visit today.   Specialty:  Orthopedic Surgery Why:  need return office visit 2 weeks postop Contact information: [address] [place] [address] [phone]           Discharge [facility]:  [facility]  Disposition:     Signed: Zonia Kief for  Annell Greening MD 01/11/2017, 1:59 PM

## 2017-01-11 NOTE — Progress Notes (Signed)
Report given to Luciano Cutter at [facility].

## 2017-01-11 NOTE — Progress Notes (Signed)
Patient discharged via stretcher with [facility].

## 2017-01-11 NOTE — Clinical Social Work Placement (Signed)
   CLINICAL SOCIAL WORK PLACEMENT  NOTE  Date:  01/11/2017  Patient Details  Name: Ariana Patton MRN: [id number] Date of Birth: 07/03/1945  Clinical Social Work is seeking post-discharge placement for this patient at the Skilled  Nursing Facility level of care (*CSW will initial, date and re-position this form in  chart as items are completed):  Yes   Patient/family provided with [place] Clinical Social Work Department's list of facilities offering this level of care within the geographic area requested by the patient (or if unable, by the patient's family).  Yes   Patient/family informed of their freedom to choose among providers that offer the needed level of care, that participate in Medicare, Medicaid or managed care program needed by the patient, have an available bed and are willing to accept the patient.  Yes   Patient/family informed of [facility]'s ownership interest in [facility] and [facility], as well as of the fact that they are under no obligation to receive care at these facilities.  PASRR submitted to EDS on       PASRR number received on       Existing PASRR number confirmed on 01/09/17     FL2 transmitted to all facilities in geographic area requested by pt/family on       FL2 transmitted to all facilities within larger geographic area on 01/09/17     Patient informed that his/her managed care company has contracts with or will negotiate with certain facilities, including the following:        Yes   Patient/family informed of bed offers received.  Patient chooses bed at [facility]     Physician recommends and patient chooses bed at      Patient to be [facility] on  .  Patient to be transferred to facility by [facility]     Patient family notified on 01/11/17 of transfer.  Name of family member notified:  son Erick contacted     PHYSICIAN       Additional Comment:     _______________________________________________ [name], LCSW 01/11/2017, 3:05 PM

## 2017-01-11 NOTE — Progress Notes (Signed)
   Subjective: 3 Days Post-Op Procedure(s) (LRB): OPEN REDUCTION INTERNAL FIXATION (ORIF) RIGHT TIBIA FRACTURE, COMPRESSION PLATE (Right) Patient reports pain as 2 on 0-10 scale.    Objective: Vital signs in last 24 hours: Temp:  [98 F (36.7 C)-98.2 F (36.8 C)] 98.2 F (36.8 C) (11/15 0700) Pulse Rate:  [80-87] 84 (11/15 0700) Resp:  [16] 16 (11/15 0700) BP: (100-110)/(47-69) 108/69 (11/15 0700) SpO2:  [94 %-97 %] 96 % (11/15 0700)  Intake/Output from previous day: 11/14 0701 - 11/15 0700 In: 480 [P.O.:480] Out: -  Intake/Output this shift: No intake/output data recorded.  Recent Labs    01/08/17 1320  HGB 13.3   Recent Labs    01/08/17 1320  WBC 7.4  RBC 4.48  HCT 40.6  PLT 344   Recent Labs    01/09/17 0557 01/10/17 0421  NA 136 140  K 5.1 3.7  CL 104 109  CO2 25 26  BUN 19 12  CREATININE 0.89 0.67  GLUCOSE 109* 82  CALCIUM 8.7* 8.6*   No results for input(s): LABPT, INR in the last 72 hours.  Neurologically intact No results found.  Assessment/Plan: 3 Days Post-Op Procedure(s) (LRB): OPEN REDUCTION INTERNAL FIXATION (ORIF) RIGHT TIBIA FRACTURE, COMPRESSION PLATE (Right) Discharge to SNF dressing change today  Ariana Patton 01/11/2017, 7:44 AM

## 2017-01-23 ENCOUNTER — Ambulatory Visit (INDEPENDENT_AMBULATORY_CARE_PROVIDER_SITE_OTHER): Payer: No Typology Code available for payment source

## 2017-01-23 ENCOUNTER — Encounter (INDEPENDENT_AMBULATORY_CARE_PROVIDER_SITE_OTHER): Payer: Self-pay | Admitting: Orthopaedic Surgery

## 2017-01-23 ENCOUNTER — Ambulatory Visit (INDEPENDENT_AMBULATORY_CARE_PROVIDER_SITE_OTHER): Payer: No Typology Code available for payment source | Admitting: Orthopaedic Surgery

## 2017-01-23 VITALS — Ht 62.0 in | Wt 135.0 lb

## 2017-01-23 DIAGNOSIS — S82201K Unspecified fracture of shaft of right tibia, subsequent encounter for closed fracture with nonunion: Secondary | ICD-10-CM | POA: Diagnosis not present

## 2017-01-23 DIAGNOSIS — S82401K Unspecified fracture of shaft of right fibula, subsequent encounter for closed fracture with nonunion: Secondary | ICD-10-CM | POA: Diagnosis not present

## 2017-01-23 NOTE — Progress Notes (Signed)
   Post-Op Visit Note   Patient: Ariana Patton           Date of Birth: September 10, 1945           MRN: [id number] Visit Date: 01/23/2017 PCP: Efrain Sella, MD   Assessment & Plan: 2 weeks postop ORIF right tibial nonunion with compression plate. X-rays look good continue nonweightbearing. Sutures removed today. Return 6 weeks repeat x-rays on return.  Chief Complaint:  Chief Complaint  Patient presents with  . Right Leg - Follow-up, Routine Post Op   Visit Diagnoses:  1. 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 Continue nonweightbearing. Repeat x-rays on return right tibia AP and lateral.  Follow-Up Instructions: No Follow-up on file.   Orders:  Orders Placed This Encounter  Procedures  . XR Tibia/Fibula Right   No orders of the defined types were placed in this encounter.   Imaging: Xr Tibia/fibula Right  Result Date: 01/23/2017 AP lateral x-rays right tibia obtained and reviewed. This shows good position of lateral plate on the tibia with 5 mm screws. Alignment looks good. Impression: Satisfactory postop tibial plating for tibial nonunion.   PMFS History: Patient Active Problem List   Diagnosis Date Noted  . Pressure injury of skin 01/09/2017  . 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 Open fracture of right tibia and fibula 10/14/2016   Past Medical History:  Diagnosis Date  . Depression   . Dizziness   . GERD (gastroesophageal reflux disease)   . MS (multiple sclerosis) (HCC)   . Multiple sclerosis (HCC)   . Vitamin D deficiency     No family history on file.  Past Surgical History:  Procedure Laterality Date  . CAST APPLICATION Right 11/15/2016   Procedure: FIBERGLASS CAST APPLICATION RIGHT TIBIA/FIBULA FRACTURE;  Surgeon: Eldred Manges, MD;  Location: [facility] OR;  Service: Orthopedics;  Laterality: Right;  . CHOLECYSTECTOMY    . EXTERNAL FIXATION LEG Right 10/13/2016   Procedure: Application of external fixator Right lower leg;  Surgeon: Eldred Manges, MD;  Location: [facility] OR;  Service: Orthopedics;  Laterality: Right;  . EXTERNAL FIXATION REMOVAL Right 11/15/2016   Procedure: REMOVAL EXTERNAL FIXATION RIGHT TIBIA/FIBULA;  Surgeon: Eldred Manges, MD;  Location: [facility] OR;  Service: Orthopedics;  Laterality: Right;  . LAPAROSCOPIC NISSEN FUNDOPLICATION    . ORIF TIBIA FRACTURE Right 01/08/2017   Procedure: OPEN REDUCTION INTERNAL FIXATION (ORIF) RIGHT TIBIA FRACTURE, COMPRESSION PLATE;  Surgeon: Eldred Manges, MD;  Location: [facility] OR;  Service: Orthopedics;  Laterality: Right;   Social History   Occupational History  . Not on file  Tobacco Use  . Smoking status: Never Smoker  . Smokeless tobacco: Never Used  Substance and Sexual Activity  . Alcohol use: No  . Drug use: No  . Sexual activity: Not on file

## 2017-02-09 ENCOUNTER — Ambulatory Visit (INDEPENDENT_AMBULATORY_CARE_PROVIDER_SITE_OTHER): Payer: Medicare Other | Admitting: Orthopaedic Surgery

## 2017-02-09 ENCOUNTER — Encounter (INDEPENDENT_AMBULATORY_CARE_PROVIDER_SITE_OTHER): Payer: Self-pay | Admitting: Orthopaedic Surgery

## 2017-02-09 ENCOUNTER — Ambulatory Visit (INDEPENDENT_AMBULATORY_CARE_PROVIDER_SITE_OTHER): Payer: Medicare Other

## 2017-02-09 DIAGNOSIS — S82201K Unspecified fracture of shaft of right tibia, subsequent encounter for closed fracture with nonunion: Secondary | ICD-10-CM | POA: Diagnosis not present

## 2017-02-09 DIAGNOSIS — S82401K Unspecified fracture of shaft of right fibula, subsequent encounter for closed fracture with nonunion: Secondary | ICD-10-CM

## 2017-02-09 NOTE — Progress Notes (Signed)
   Post-Op Visit Note   Patient: Ariana Patton           Date of Birth: [birth year]           MRN: [id number] Visit Date: 02/09/2017 PCP: Efrain Sella, MD   Assessment & Plan: Patient went to the dentist to get her foot stuck underneath the edge of the wheelchair with a twisting motion.  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  Plate and screws of the tibia is a good position.  She will continue nonweightbearing for 4 weeks I will recheck her in 1 month.  Repeat x-rays on return.  2 view x-rays today demonstrates tibial plate and screws in good position and the fibula appears to be healing.  Chief Complaint:  Chief Complaint  Patient presents with  . Right Leg - Pain   Visit Diagnoses:  1. 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  Follow-Up Instructions: No Follow-up on file.   Orders:  Orders Placed This Encounter  Procedures  . XR Tibia/Fibula Right   No orders of the defined types were placed in this encounter.   Imaging: No results found.  PMFS History: Patient Active Problem List   Diagnosis Date Noted  . Pressure injury of skin 01/09/2017  . 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 Open fracture of right tibia and fibula 10/14/2016   Past Medical History:  Diagnosis Date  . Depression   . Dizziness   . GERD (gastroesophageal reflux disease)   . MS (multiple sclerosis) (HCC)   . Multiple sclerosis (HCC)   . Vitamin D deficiency     No family history on file.  Past Surgical History:  Procedure Laterality Date  . CAST APPLICATION Right 11/15/2016   Procedure: FIBERGLASS CAST APPLICATION RIGHT TIBIA/FIBULA FRACTURE;  Surgeon: Eldred Manges, MD;  Location: [facility] OR;  Service: Orthopedics;   Laterality: Right;  . CHOLECYSTECTOMY    . EXTERNAL FIXATION LEG Right 10/13/2016   Procedure: Application of external fixator Right lower leg;  Surgeon: Eldred Manges, MD;  Location: [facility] OR;  Service: Orthopedics;  Laterality: Right;  . EXTERNAL FIXATION REMOVAL Right 11/15/2016   Procedure: REMOVAL EXTERNAL FIXATION RIGHT TIBIA/FIBULA;  Surgeon: Eldred Manges, MD;  Location: [facility] OR;  Service: Orthopedics;  Laterality: Right;  . LAPAROSCOPIC NISSEN FUNDOPLICATION    . ORIF TIBIA FRACTURE Right 01/08/2017   Procedure: OPEN REDUCTION INTERNAL FIXATION (ORIF) RIGHT TIBIA FRACTURE, COMPRESSION PLATE;  Surgeon: Eldred Manges, MD;  Location: [facility] OR;  Service: Orthopedics;  Laterality: Right;   Social History   Occupational History  . Not on file  Tobacco Use  . Smoking status: Never Smoker  . Smokeless tobacco: Never Used  Substance and Sexual Activity  . Alcohol use: No  . Drug use: No  . Sexual activity: Not on file

## 2017-03-06 ENCOUNTER — Ambulatory Visit (INDEPENDENT_AMBULATORY_CARE_PROVIDER_SITE_OTHER): Payer: Medicare Other | Admitting: Orthopaedic Surgery

## 2017-03-06 ENCOUNTER — Ambulatory Visit (INDEPENDENT_AMBULATORY_CARE_PROVIDER_SITE_OTHER): Payer: Medicare Other

## 2017-03-06 ENCOUNTER — Encounter (INDEPENDENT_AMBULATORY_CARE_PROVIDER_SITE_OTHER): Payer: Self-pay | Admitting: Orthopaedic Surgery

## 2017-03-06 DIAGNOSIS — S82201K Unspecified fracture of shaft of right tibia, subsequent encounter for closed fracture with nonunion: Secondary | ICD-10-CM

## 2017-03-06 DIAGNOSIS — S82401K Unspecified fracture of shaft of right fibula, subsequent encounter for closed fracture with nonunion: Secondary | ICD-10-CM

## 2017-03-06 NOTE — Progress Notes (Signed)
   Post-Op Visit Note   Patient: Ariana Patton           Date of Birth: [birth year]           MRN: [id number] Visit Date: 03/06/2017 PCP: Efrain Sella, MD   Assessment & Plan: Patient almost 2 months post right tibia compression plate fixation for tibial nonunion/malunion.  Chief Complaint:  Chief Complaint  Patient presents with  . Right Leg - Routine Post Op   Visit Diagnoses:  1. 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  She has been nonambulatory for several months and she knows it will take some time for her to build up her stamina to begin independent ambulation once again with a walker.  She has had previous distal femur fracture after total knee arthroplasty and is in some slight genu valgus.  Her tibia straight and x-rays show good position and alignment on 2 views with consolidation and also healing of the fibula.  Follow-Up Instructions: No Follow-up on file.   Orders:  Orders Placed This Encounter  Procedures  . XR Tibia/Fibula Right   No orders of the defined types were placed in this encounter.   Imaging: No results found.  PMFS History: Patient Active Problem List   Diagnosis Date Noted  . Pressure injury of skin 01/09/2017  . 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 Open fracture of right tibia and fibula 10/14/2016   Past Medical History:  Diagnosis Date  . Depression   . Dizziness   . GERD (gastroesophageal reflux disease)   . MS (multiple sclerosis) (HCC)   . Multiple sclerosis (HCC)   . Vitamin D deficiency     No family history on file.  Past Surgical History:  Procedure Laterality Date  . CAST APPLICATION Right 11/15/2016   Procedure: FIBERGLASS CAST APPLICATION RIGHT TIBIA/FIBULA FRACTURE;  Surgeon: Eldred Manges, MD;  Location: [facility] OR;  Service:  Orthopedics;  Laterality: Right;  . CHOLECYSTECTOMY    . EXTERNAL FIXATION LEG Right 10/13/2016   Procedure: Application of external fixator Right lower leg;  Surgeon: Eldred Manges, MD;  Location: [facility] OR;  Service: Orthopedics;  Laterality: Right;  . EXTERNAL FIXATION REMOVAL Right 11/15/2016   Procedure: REMOVAL EXTERNAL FIXATION RIGHT TIBIA/FIBULA;  Surgeon: Eldred Manges, MD;  Location: [facility] OR;  Service: Orthopedics;  Laterality: Right;  . LAPAROSCOPIC NISSEN FUNDOPLICATION    . ORIF TIBIA FRACTURE Right 01/08/2017   Procedure: OPEN REDUCTION INTERNAL FIXATION (ORIF) RIGHT TIBIA FRACTURE, COMPRESSION PLATE;  Surgeon: Eldred Manges, MD;  Location: [facility] OR;  Service: Orthopedics;  Laterality: Right;   Social History   Occupational History  . Not on file  Tobacco Use  . Smoking status: Never Smoker  . Smokeless tobacco: Never Used  Substance and Sexual Activity  . Alcohol use: No  . Drug use: No  . Sexual activity: Not on file

## 2017-03-07 ENCOUNTER — Telehealth (INDEPENDENT_AMBULATORY_CARE_PROVIDER_SITE_OTHER): Payer: Self-pay | Admitting: Orthopaedic Surgery

## 2017-03-07 NOTE — Telephone Encounter (Signed)
Ebony-nurse with [facility] called asked if she can D/C the cam boot since patient is now weight bearing as tolerated. The number to contact Karel Jarvis is [phone]

## 2017-03-07 NOTE — Telephone Encounter (Signed)
I called Ariana Patton and advised. Per last office note, patient should begin WBAT in CAM boot. She can work her way in to a tennis shoe as tolerated.

## 2017-09-11 ENCOUNTER — Ambulatory Visit (INDEPENDENT_AMBULATORY_CARE_PROVIDER_SITE_OTHER): Payer: Medicare Other

## 2017-09-11 ENCOUNTER — Ambulatory Visit (INDEPENDENT_AMBULATORY_CARE_PROVIDER_SITE_OTHER): Payer: Medicare Other | Admitting: Orthopaedic Surgery

## 2017-09-11 VITALS — BP 122/75

## 2017-09-11 DIAGNOSIS — M25561 Pain in right knee: Secondary | ICD-10-CM

## 2017-09-11 NOTE — Progress Notes (Signed)
Office Visit Note   Patient: Ariana Patton           Date of Birth: [birth year]           MRN: [id number] Visit Date: 09/11/2017              Requested by: No referring provider defined for this encounter. PCP: Efrain Sella, MD (Inactive)   Assessment & Plan: Visit Diagnoses:  1. Right knee pain, unspecified chronicity     Plan: Patient will continue with therapy efforts with mobilization.  She was not able to walk for multiple months and has had history of multiple falls.  X-rays show no evidence of loosening the total knee arthroplasty.  She can return as needed.  Follow-Up Instructions: No follow-ups on file.   Orders:  Orders Placed This Encounter  Procedures  . XR Knee 1-2 Views Right   No orders of the defined types were placed in this encounter.     Procedures: No procedures performed   Clinical Data: No additional findings.   Subjective: Chief Complaint  Patient presents with  . Right Knee - Pain    HPI 72 year old female is seen post compression plate 99/35/7017 for tibial nonunion/malunion.  She is had more symptoms at her right knee where she had an old distal right femur supracondylar fracture fixed with a plate in [place] adjacent to total knee arthroplasty.  She is doing therapy with ambulation with a walker.  Most time she is in a wheelchair.  She is used extra strength Tylenol for discomfort.  She denies groin pain no fever or chills.  Review of Systems reviewed updated and unchanged since her surgery 01/08/2017.   Objective: Vital Signs: BP 122/75   Physical Exam  Constitutional: She is oriented to person, place, and time. She appears well-developed.  HENT:  Head: Normocephalic.  Right Ear: External ear normal.  Left Ear: External ear normal.  Eyes: Pupils are equal, round, and reactive to light.  Neck: No tracheal deviation present. No thyromegaly present.  Cardiovascular: Normal rate.  Pulmonary/Chest: Effort normal.  Abdominal: Soft.    Neurological: She is alert and oriented to person, place, and time.  Skin: Skin is warm and dry.  Psychiatric: She has a normal mood and affect. Her behavior is normal.    Ortho Exam decreased quad strength gastrocsoleus strength bilaterally.  Well-healed incision over tibial plating.  Trace edema.  She has some valgus from her old healed supracondylar femur fracture.  Total knee arthroplasty incision is well-healed.  No pain with hip range of motion.  No cellulitis.  Specialty Comments:  No specialty comments available.  Imaging: No results found.   PMFS History: Patient Active Problem List   Diagnosis Date Noted  . Pressure injury of skin 01/09/2017  . 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 Open fracture of right tibia and fibula 10/14/2016   Past Medical History:  Diagnosis Date  . Depression   . Dizziness   . GERD (gastroesophageal reflux disease)   . MS (multiple sclerosis) (HCC)   . Multiple sclerosis (HCC)   . Vitamin D deficiency     No family history on file.  Past Surgical History:  Procedure Laterality Date  . CAST APPLICATION Right 11/15/2016   Procedure: FIBERGLASS CAST APPLICATION RIGHT TIBIA/FIBULA FRACTURE;  Surgeon: Eldred Manges, MD;  Location: [facility] OR;  Service: Orthopedics;  Laterality: Right;  . CHOLECYSTECTOMY    . EXTERNAL FIXATION LEG Right 10/13/2016  Procedure: Application of external fixator Right lower leg;  Surgeon: Eldred Manges, MD;  Location: [facility] OR;  Service: Orthopedics;  Laterality: Right;  . EXTERNAL FIXATION REMOVAL Right 11/15/2016   Procedure: REMOVAL EXTERNAL FIXATION RIGHT TIBIA/FIBULA;  Surgeon: Eldred Manges, MD;  Location: [facility] OR;  Service: Orthopedics;  Laterality: Right;  . LAPAROSCOPIC NISSEN FUNDOPLICATION    . ORIF TIBIA FRACTURE Right 01/08/2017   Procedure: OPEN REDUCTION INTERNAL FIXATION (ORIF) RIGHT TIBIA FRACTURE, COMPRESSION PLATE;  Surgeon: Eldred Manges, MD;  Location: [facility] OR;   Service: Orthopedics;  Laterality: Right;   Social History   Occupational History  . Not on file  Tobacco Use  . Smoking status: Never Smoker  . Smokeless tobacco: Never Used  Substance and Sexual Activity  . Alcohol use: No  . Drug use: No  . Sexual activity: Not on file

## 2017-11-20 ENCOUNTER — Ambulatory Visit (INDEPENDENT_AMBULATORY_CARE_PROVIDER_SITE_OTHER): Payer: No Typology Code available for payment source | Admitting: Orthopaedic Surgery

## 2017-11-27 ENCOUNTER — Ambulatory Visit (INDEPENDENT_AMBULATORY_CARE_PROVIDER_SITE_OTHER): Payer: Medicare Other | Admitting: Orthopaedic Surgery

## 2017-11-27 ENCOUNTER — Encounter (INDEPENDENT_AMBULATORY_CARE_PROVIDER_SITE_OTHER): Payer: Self-pay | Admitting: Orthopaedic Surgery

## 2017-11-27 VITALS — BP 108/76 | HR 69 | Ht 62.0 in | Wt 140.0 lb

## 2017-11-27 DIAGNOSIS — M21061 Valgus deformity, not elsewhere classified, right knee: Secondary | ICD-10-CM

## 2017-11-27 NOTE — Progress Notes (Signed)
Office Visit Note   Patient: Ariana Patton           Date of Birth: October 02, 1945           MRN: [id number] Visit Date: 11/27/2017              Requested by: No referring provider defined for this encounter. PCP: Efrain Sella, MD (Inactive)   Assessment & Plan: Visit Diagnoses:  1. Acquired genu valgum of right knee     Plan: We discussed recommendations we will continue to work on physical therapy for endurance.  Her medial collateral ligament is intact but from her old distal femur fracture with plating she is in some valgus position.  We discussed options for correcting this which would be a significant surgical procedure with risk of nonunion if distal femoral osteotomy was performed taking out the plate performing an osteotomy and re-fixation versus total knee arthroplasty revision to correct her valgus to the prosthesis.  With the femoral prosthetic revision she would have laxity of the medial collateral ligament and with screws present would have to have femoral plate removed for a more stabilized prosthesis.  Remaining option would be plate removal and retrograde femoral nail with femoral osteotomy.  She does not have a knee effusion and is not stretched out her medial collateral ligament.  Her tibia fracture is completely healed.  She will continue to work on walking and try to increase her endurance.  We spent extensive time discussing surgical options for correction of her problem.  She certainly could seek another opinion if she so desires.  If she decides she would like to consider proceeding with surgery she can return.  Follow-Up Instructions: Return if symptoms worsen or fail to improve.   Orders:  No orders of the defined types were placed in this encounter.  No orders of the defined types were placed in this encounter.     Procedures: No procedures performed   Clinical Data: No additional findings.   Subjective: Chief Complaint  Patient presents with  . Right  Knee - Pain    HPI 72 year old female staying in a nursing center here with ongoing problems with right knee pain.  She had past history of total knee arthroplasty done in [place] and then later had a periprosthetic distal femur fracture treated with the plate that healed in valgus.  I saw her after a tibia fracture 1 year ago initially treated with external fixator followed by casting with fibrous nonunion treated with takedown and compression plate fixation done on 01/08/2017 which is healed.  She tires easily with ambulation she has some medial discomfort from her valgus knee.  She ambulates with a walker with therapy.  She does not have any family here locally.  She denies associated back pain.  She does have type 2 diabetes and also history of multiple sclerosis.  Review of Systems positive for previous cholecystectomy patient is never smoked.  Positive for depression type 2 diabetes, GERD, multiple sclerosis, vitamin D deficiency with osteoporosis.   Objective: Vital Signs: BP 108/76   Pulse 69   Ht 5\' 2"  (1.575 m)   Wt 140 lb (63.5 kg)   BMI 25.61 kg/m   Physical Exam  Constitutional: She is oriented to person, place, and time. She appears well-developed.  HENT:  Head: Normocephalic.  Right Ear: External ear normal.  Left Ear: External ear normal.  Eyes: Pupils are equal, round, and reactive to light.  Neck: No tracheal deviation present. No thyromegaly present.  Cardiovascular: Normal rate.  Pulmonary/Chest: Effort normal.  Abdominal: Soft.  Neurological: She is alert and oriented to person, place, and time.  Skin: Skin is warm and dry.  Psychiatric: She has a normal mood and affect. Her behavior is normal.    Ortho Exam patient has genu valgum.  Well-healed lateral thigh incision from lateral plating.  Midline anterior incision over knee from total knee arthroplasty.  Distal screws from the plate are slightly palpable over the medial follow-up femoral condyle without  significant tenderness.  Healed external fixator scars.  Well-healed distal tibia incision along the lateral aspect the tibial crest without tenderness.  Tibia is straight.  Clinically she is in 20 to 30 degrees of valgus.  Distal pulses are intact sensation foot is intact.  Specialty Comments:  No specialty comments available.  Imaging: No results found.   PMFS History: Patient Active Problem List   Diagnosis Date Noted  . Pressure injury of skin 01/09/2017  . 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 Open fracture of right tibia and fibula 10/14/2016   Past Medical History:  Diagnosis Date  . Depression   . Dizziness   . GERD (gastroesophageal reflux disease)   . MS (multiple sclerosis) (HCC)   . Multiple sclerosis (HCC)   . Vitamin D deficiency     No family history on file.  Past Surgical History:  Procedure Laterality Date  . CAST APPLICATION Right 11/15/2016   Procedure: FIBERGLASS CAST APPLICATION RIGHT TIBIA/FIBULA FRACTURE;  Surgeon: Eldred Manges, MD;  Location: [facility] OR;  Service: Orthopedics;  Laterality: Right;  . CHOLECYSTECTOMY    . EXTERNAL FIXATION LEG Right 10/13/2016   Procedure: Application of external fixator Right lower leg;  Surgeon: Eldred Manges, MD;  Location: [facility] OR;  Service: Orthopedics;  Laterality: Right;  . EXTERNAL FIXATION REMOVAL Right 11/15/2016   Procedure: REMOVAL EXTERNAL FIXATION RIGHT TIBIA/FIBULA;  Surgeon: Eldred Manges, MD;  Location: [facility] OR;  Service: Orthopedics;  Laterality: Right;  . LAPAROSCOPIC NISSEN FUNDOPLICATION    . ORIF TIBIA FRACTURE Right 01/08/2017   Procedure: OPEN REDUCTION INTERNAL FIXATION (ORIF) RIGHT TIBIA FRACTURE, COMPRESSION PLATE;  Surgeon: Eldred Manges, MD;  Location: [facility] OR;  Service: Orthopedics;  Laterality: Right;   Social History   Occupational History  . Not on file  Tobacco Use  . Smoking status: Never Smoker  . Smokeless tobacco: Never Used  Substance and Sexual  Activity  . Alcohol use: No  . Drug use: No  . Sexual activity: Not on file

## 2018-07-26 IMAGING — DX DG CHEST 1V
1 series · 1 of 1 positions shown · non-contrast
Comparison: None.

CLINICAL DATA: Fall with leg fracture

EXAM:
CHEST 1 VIEW

[x chest ap]
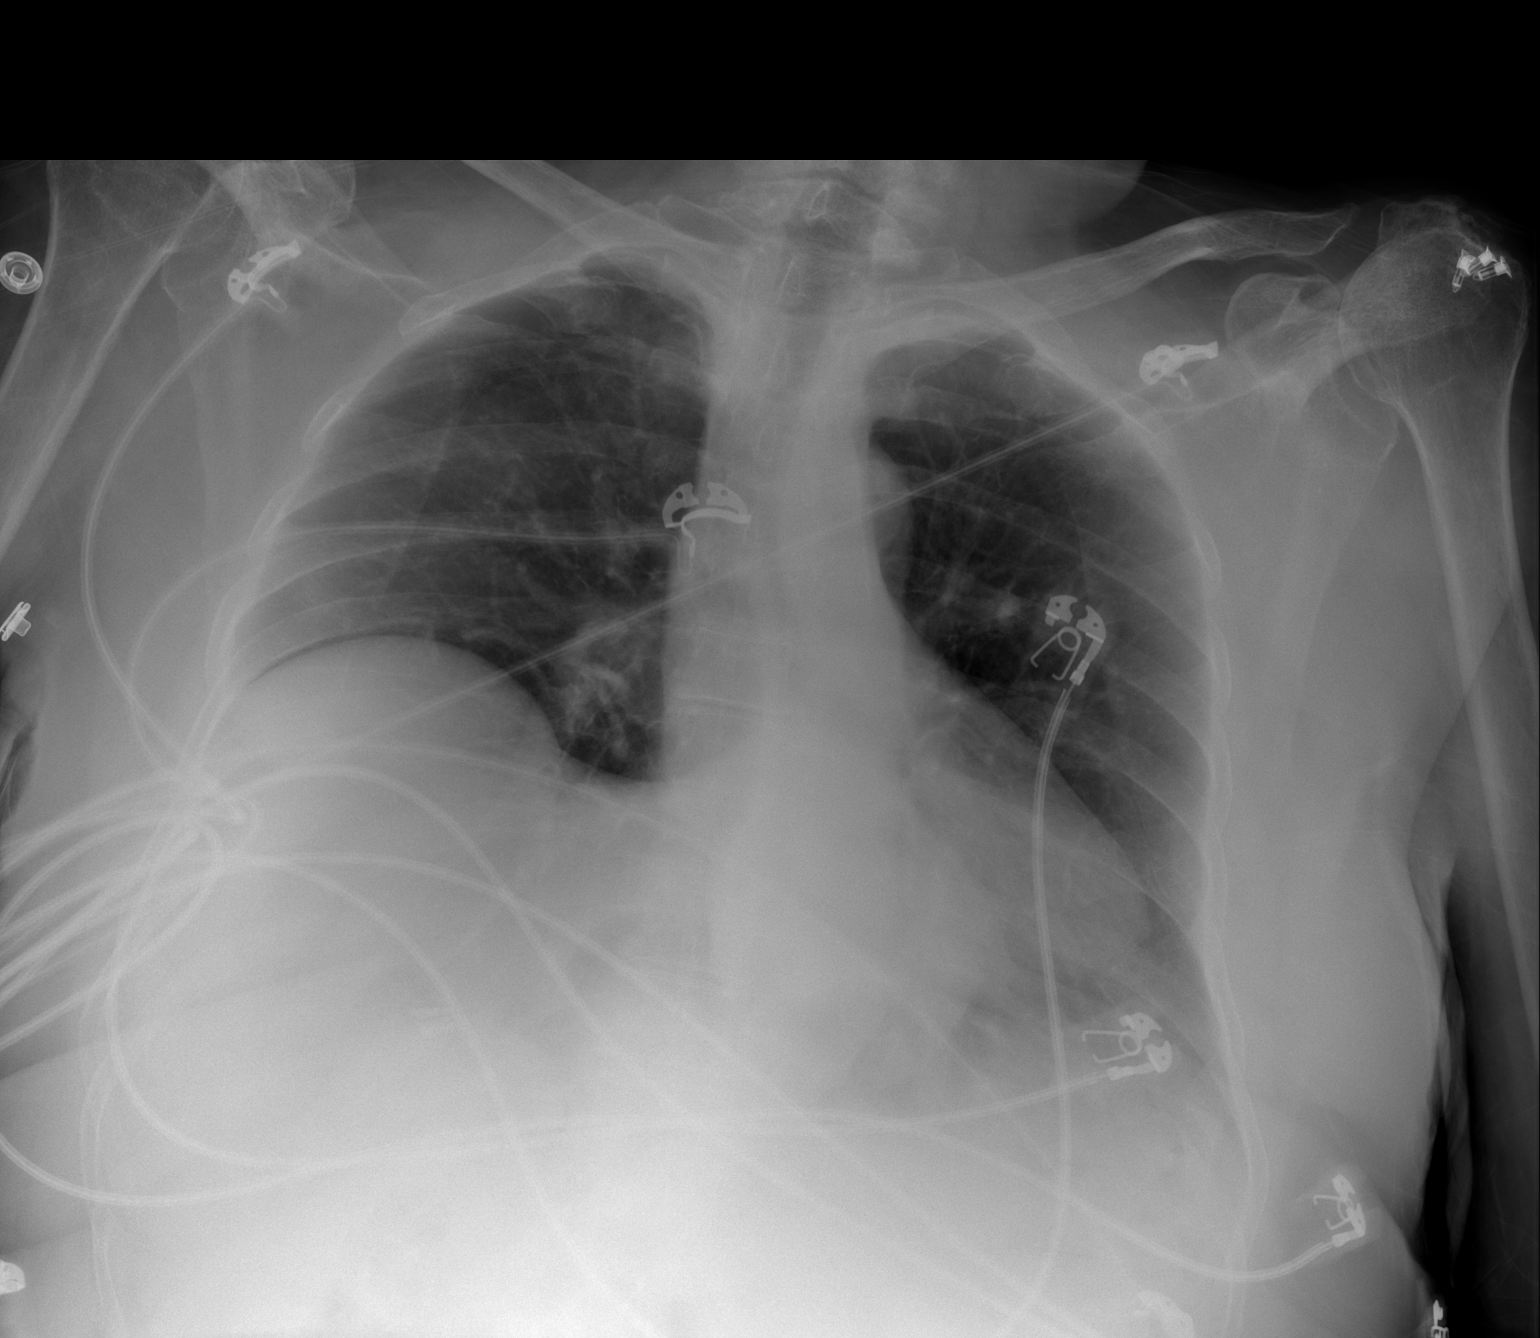

[1 of 1 positions shown; findings below may reference images not displayed]

FINDINGS: Elevation of the right hemidiaphragm. No pneumothorax or sizable
pleural effusion. No focal airspace consolidation or pulmonary
edema. Moderate hiatal hernia.
IMPRESSION: No active disease.

## 2018-07-27 IMAGING — RF DG TIBIA/FIBULA 2V*R*
1 series · 2 of 2 positions shown · non-contrast
Comparison: 10/13/2016

CLINICAL DATA: External fixation

EXAM:
DG C-ARM 61-120 MIN; RIGHT TIBIA AND FIBULA - 2 VIEW

[Series 1: run · 2 of 2 slices shown]
[im 1/2]
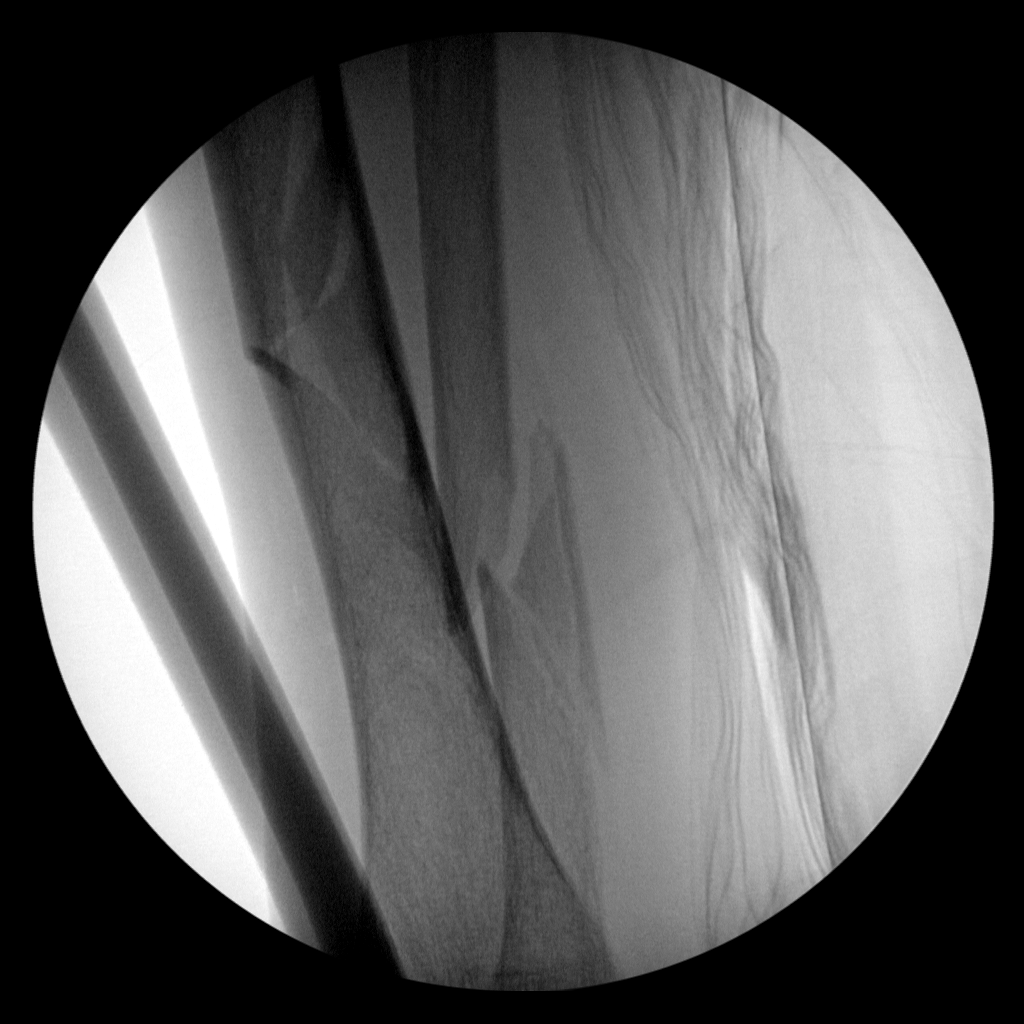
[im 2/2]
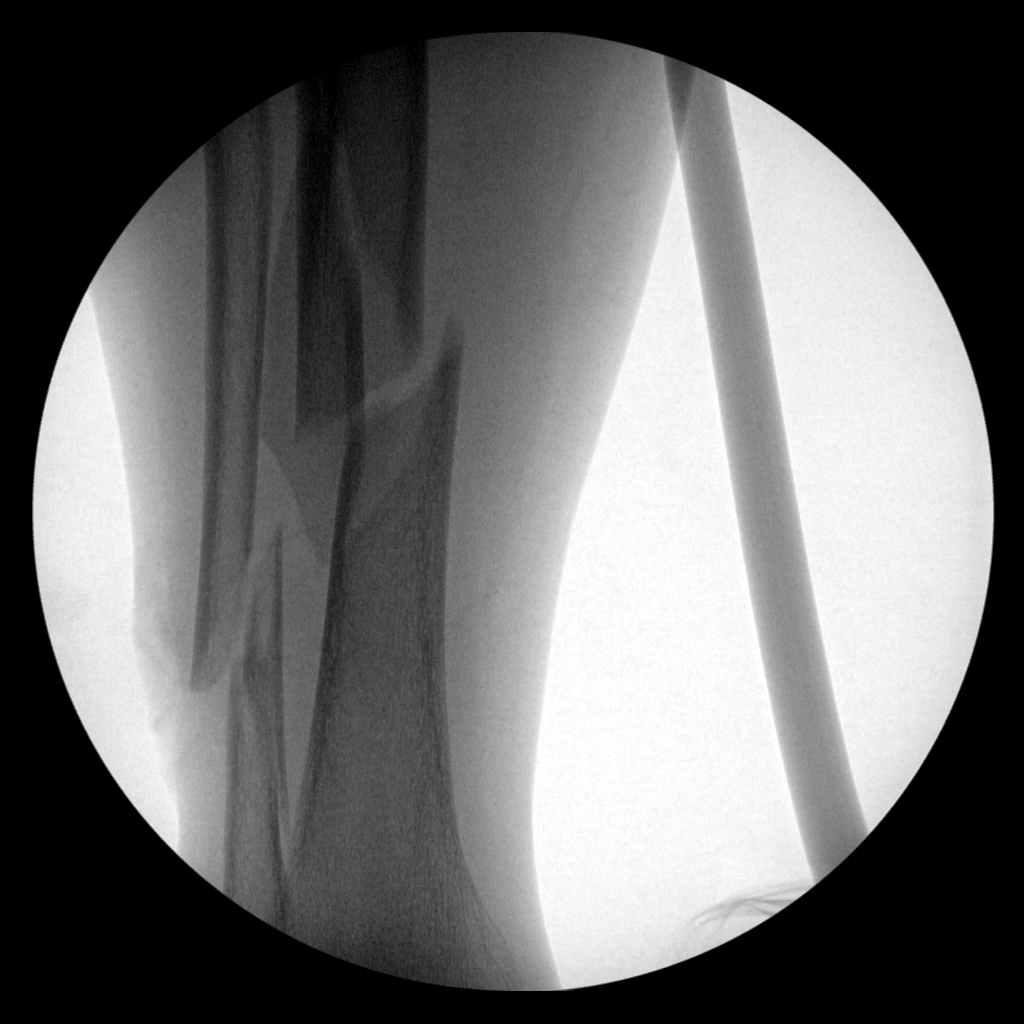

[2 of 2 positions shown; findings below may reference images not displayed]

FINDINGS: Total fluoroscopy time was 18 seconds. Two low resolution spot
intraoperative views of the right tibia and fibula. Re- demonstrated
comminuted and displaced fractures of the distal shafts of the tibia
and fibula with decreased angulation compared to preoperative
radiographs.
IMPRESSION: Intraoperative fluoroscopic assistance provided during external
fixation of tibial and fibular fracture

## 2018-08-28 IMAGING — RF DG TIBIA/FIBULA 2V*R*
1 series · 2 of 2 positions shown · non-contrast
Comparison: Tib-fib radiographs dated 11/14/2016

CLINICAL DATA: Close reduction of right tib-fib

EXAM:
DG C-ARM 61-120 MIN; RIGHT TIBIA AND FIBULA - 2 VIEW
FLUOROSCOPY TIME:  9 seconds

[Series 1: run · 2 of 2 slices shown]
[im 1/2]
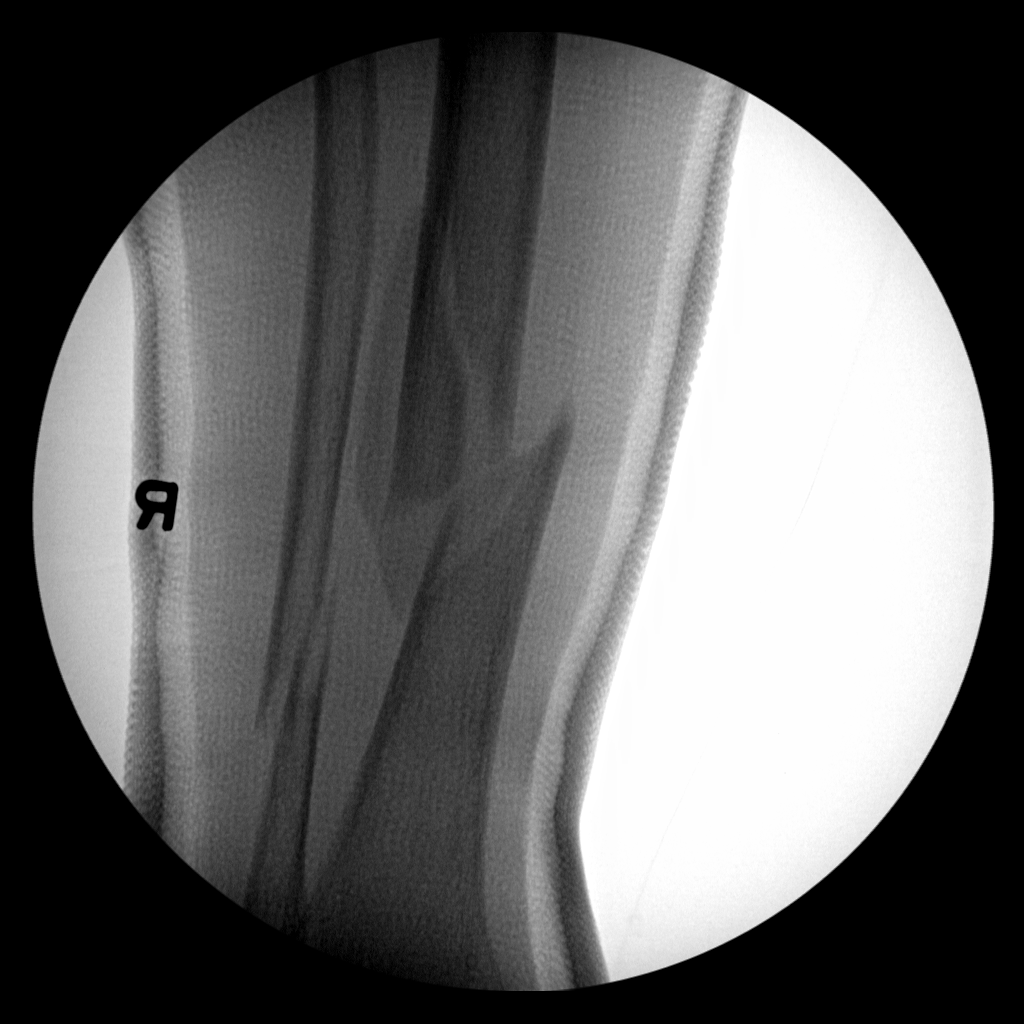
[im 2/2]
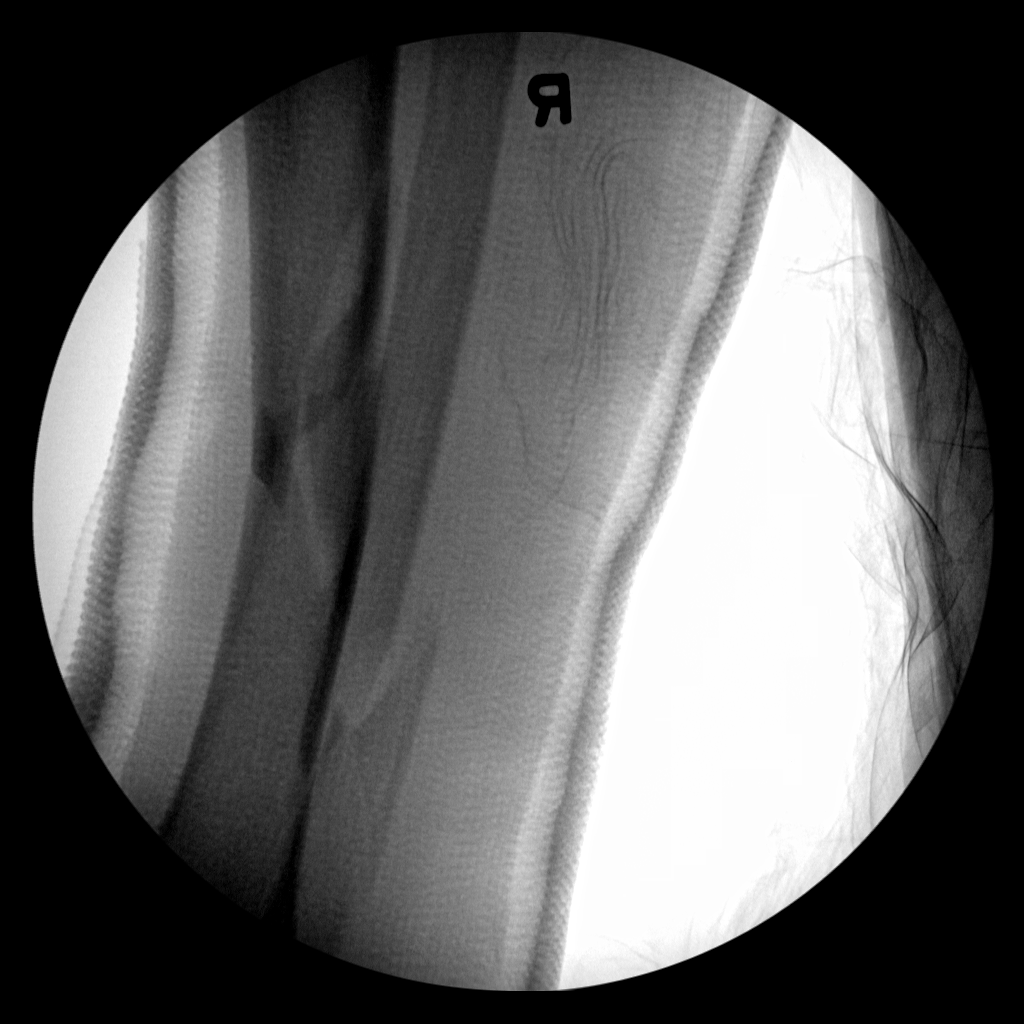

[2 of 2 positions shown; findings below may reference images not displayed]

FINDINGS: Overlying cast obscures fine osseous detail.

Removal of external fixators.

Comminuted mid/distal tibial shaft fracture, with mild medial
displacement of the dominant distal fracture fragment.

Oblique distal fibular shaft fracture with mild medial displacement
of the distal fracture fragment.
IMPRESSION: Distal tibial and fibular shaft fractures, mildly displaced, as
above.
